# Patient Record
Sex: Male | Born: 1955 | Race: Black or African American | Hispanic: No | Marital: Single | Smoking: Current every day smoker
Health system: Southern US, Community
[De-identification: ages and names within clinical notes are randomized; demographics above are authoritative.]

## PROBLEM LIST (undated history)

## (undated) DIAGNOSIS — S069X9A Unspecified intracranial injury with loss of consciousness of unspecified duration, initial encounter: Secondary | ICD-10-CM

## (undated) DIAGNOSIS — I2699 Other pulmonary embolism without acute cor pulmonale: Secondary | ICD-10-CM

## (undated) DIAGNOSIS — S069XAA Unspecified intracranial injury with loss of consciousness status unknown, initial encounter: Secondary | ICD-10-CM

## (undated) HISTORY — PX: BRAIN SURGERY: SHX531

## (undated) HISTORY — PX: OTHER SURGICAL HISTORY: SHX169

---

## 2005-10-07 DIAGNOSIS — I2699 Other pulmonary embolism without acute cor pulmonale: Secondary | ICD-10-CM

## 2005-10-07 HISTORY — DX: Other pulmonary embolism without acute cor pulmonale: I26.99

## 2007-06-24 ENCOUNTER — Emergency Department (HOSPITAL_COMMUNITY): Admission: EM | Admit: 2007-06-24 | Discharge: 2007-06-24 | Payer: Self-pay | Admitting: Family Medicine

## 2007-11-10 ENCOUNTER — Ambulatory Visit: Payer: Self-pay | Admitting: Internal Medicine

## 2008-11-22 ENCOUNTER — Encounter: Admission: RE | Admit: 2008-11-22 | Discharge: 2009-02-20 | Payer: Self-pay | Admitting: Psychology

## 2012-09-05 ENCOUNTER — Emergency Department (INDEPENDENT_AMBULATORY_CARE_PROVIDER_SITE_OTHER)
Admission: EM | Admit: 2012-09-05 | Discharge: 2012-09-05 | Disposition: A | Discharge status: Nursing Home (Includes ICF) | Payer: Medicare Other | Source: Home / Self Care | Attending: Emergency Medicine | Admitting: Emergency Medicine

## 2012-09-05 ENCOUNTER — Encounter (HOSPITAL_COMMUNITY): Payer: Self-pay | Admitting: Physical Medicine and Rehabilitation

## 2012-09-05 ENCOUNTER — Observation Stay (HOSPITAL_COMMUNITY)
Admission: EM | Admit: 2012-09-05 | Discharge: 2012-09-07 | Disposition: A | Discharge status: Home / Self Care | Payer: Medicare Other | Attending: Internal Medicine | Admitting: Internal Medicine

## 2012-09-05 ENCOUNTER — Encounter (HOSPITAL_COMMUNITY): Payer: Self-pay | Admitting: Emergency Medicine

## 2012-09-05 DIAGNOSIS — E876 Hypokalemia: Secondary | ICD-10-CM | POA: Insufficient documentation

## 2012-09-05 DIAGNOSIS — F17200 Nicotine dependence, unspecified, uncomplicated: Secondary | ICD-10-CM | POA: Diagnosis not present

## 2012-09-05 DIAGNOSIS — R7989 Other specified abnormal findings of blood chemistry: Secondary | ICD-10-CM | POA: Diagnosis present

## 2012-09-05 DIAGNOSIS — I82409 Acute embolism and thrombosis of unspecified deep veins of unspecified lower extremity: Secondary | ICD-10-CM | POA: Diagnosis not present

## 2012-09-05 DIAGNOSIS — M25469 Effusion, unspecified knee: Secondary | ICD-10-CM

## 2012-09-05 DIAGNOSIS — M79609 Pain in unspecified limb: Secondary | ICD-10-CM

## 2012-09-05 DIAGNOSIS — R799 Abnormal finding of blood chemistry, unspecified: Secondary | ICD-10-CM

## 2012-09-05 DIAGNOSIS — D6859 Other primary thrombophilia: Secondary | ICD-10-CM | POA: Diagnosis not present

## 2012-09-05 DIAGNOSIS — M7989 Other specified soft tissue disorders: Secondary | ICD-10-CM

## 2012-09-05 DIAGNOSIS — R748 Abnormal levels of other serum enzymes: Secondary | ICD-10-CM | POA: Insufficient documentation

## 2012-09-05 DIAGNOSIS — D6862 Lupus anticoagulant syndrome: Secondary | ICD-10-CM | POA: Diagnosis present

## 2012-09-05 HISTORY — DX: Unspecified intracranial injury with loss of consciousness status unknown, initial encounter: S06.9XAA

## 2012-09-05 HISTORY — DX: Unspecified intracranial injury with loss of consciousness of unspecified duration, initial encounter: S06.9X9A

## 2012-09-05 HISTORY — DX: Other pulmonary embolism without acute cor pulmonale: I26.99

## 2012-09-05 LAB — BASIC METABOLIC PANEL
BUN: 13 mg/dL (ref 6–23)
Calcium: 10 mg/dL (ref 8.4–10.5)
GFR calc Af Amer: 60 mL/min — ABNORMAL LOW (ref >90)
GFR calc non Af Amer: 52 mL/min — ABNORMAL LOW (ref >90)
Potassium: 3.4 mEq/L — ABNORMAL LOW (ref 3.5–5.1)
Sodium: 131 mEq/L — ABNORMAL LOW (ref 135–145)

## 2012-09-05 LAB — URINALYSIS, ROUTINE W REFLEX MICROSCOPIC
Glucose, UA: NEGATIVE mg/dL
Hgb urine dipstick: NEGATIVE
Ketones, ur: NEGATIVE mg/dL
Leukocytes, UA: NEGATIVE
Nitrite: NEGATIVE
Protein, ur: NEGATIVE mg/dL
Specific Gravity, Urine: 1.017 (ref 1.005–1.030)
Urobilinogen, UA: 1 mg/dL (ref 0.0–1.0)
pH: 6 (ref 5.0–8.0)

## 2012-09-05 LAB — PROTIME-INR
INR: 1.25 (ref 0.00–1.49)
Prothrombin Time: 15.5 s — ABNORMAL HIGH (ref 11.6–15.2)

## 2012-09-05 LAB — CBC
HCT: 37.5 % — ABNORMAL LOW (ref 39.0–52.0)
MCH: 28.6 pg (ref 26.0–34.0)
MCHC: 33.6 g/dL (ref 30.0–36.0)
RDW: 13.5 % (ref 11.5–15.5)

## 2012-09-05 MED ORDER — WARFARIN - PHARMACIST DOSING INPATIENT: Freq: Every day | Status: DC

## 2012-09-05 MED ORDER — SODIUM CHLORIDE 0.9 % IJ SOLN
3.0000 mL | Freq: Two times a day (BID) | INTRAMUSCULAR | Status: DC
  Administered 2012-09-06: 3 mL via INTRAVENOUS

## 2012-09-05 MED ORDER — HEPARIN (PORCINE) IN NACL 100-0.45 UNIT/ML-% IJ SOLN: 4000.0000 [IU]/h | INTRAMUSCULAR | Status: DC

## 2012-09-05 MED ORDER — WARFARIN SODIUM 7.5 MG PO TABS
7.5000 mg | ORAL_TABLET | ORAL | Status: AC
  Administered 2012-09-05: 7.5 mg via ORAL
  Filled 2012-09-05: qty 1

## 2012-09-05 MED ORDER — MORPHINE SULFATE 4 MG/ML IJ SOLN
4.0000 mg | Freq: Once | INTRAMUSCULAR | Status: AC
  Administered 2012-09-05: 4 mg via INTRAVENOUS
  Filled 2012-09-05: qty 1

## 2012-09-05 MED ORDER — HEPARIN BOLUS VIA INFUSION
4000.0000 [IU] | Freq: Once | INTRAVENOUS | Status: AC
  Administered 2012-09-05: 4000 [IU] via INTRAVENOUS

## 2012-09-05 MED ORDER — HEPARIN (PORCINE) IN NACL 100-0.45 UNIT/ML-% IJ SOLN
14.0000 [IU]/kg/h | INTRAMUSCULAR | Status: DC
  Filled 2012-09-05: qty 250

## 2012-09-05 MED ORDER — HEPARIN (PORCINE) IN NACL 100-0.45 UNIT/ML-% IJ SOLN
1100.0000 [IU]/h | INTRAMUSCULAR | Status: DC
  Administered 2012-09-05: 1100 [IU]/h via INTRAVENOUS
  Filled 2012-09-05 (×2): qty 250

## 2012-09-05 NOTE — ED Notes (Signed)
Heparin dosage verified by Heron Sabins

## 2012-09-05 NOTE — ED Provider Notes (Signed)
Medical screening examination/treatment/procedure(s) were performed by non-physician practitioner and as supervising physician I was immediately available for consultation/collaboration.  Leslee Home, M.D.   Reuben Likes, MD 09/05/12 2051

## 2012-09-05 NOTE — Progress Notes (Signed)
VASCULAR LAB PRELIMINARY  PRELIMINARY  PRELIMINARY  PRELIMINARY  Left lower extremity venous Doppler completed.    Preliminary report:  There is extensive, occlusive, acute DVT noted throughout the left lower extremity, beginning in the posterior tibial vein and coursing through to the popliteal, femoral, and common femoral veins.  There is no propagation the right lower extremity.  Antonio Fernandez, 09/05/2012, 6:07 PM

## 2012-09-05 NOTE — ED Notes (Signed)
Patient resting in bed Heparin infusing at 11units per hour no new changes in assessment.

## 2012-09-05 NOTE — Progress Notes (Addendum)
ANTICOAGULATION CONSULT NOTE - Initial Consult  Pharmacy Consult for heparin + coumadin Indication: DVT  No Known Allergies  Patient Measurements:   Heparin Dosing Weight: 70 kg  Vital Signs: Temp: 98.7 F (37.1 C) (11/30 1543) Temp src: Oral (11/30 1543) BP: 150/95 mmHg (11/30 1853) Pulse Rate: 97  (11/30 1856)  Labs:  [facility] 09/05/12 1917  HGB 12.6*  HCT 37.5*  PLT 121*  APTT --  LABPROT --  INR --  HEPARINUNFRC --  CREATININE --  CKTOTAL --  CKMB --  TROPONINI --    CrCl is unknown because no creatinine reading has been taken and the patient has no height on file.   Medical History: No past medical history on file.  Medications:  Scheduled:    .  morphine injection  4 mg Intravenous Once   Infusions:    . heparin    . heparin    . [DISCONTINUED] heparin      Assessment: 56 yr with DVT of left leg will be started on heparin therapy.  H/H 12.6/37.5; Plt 121. Not on oral anticoagulant prior to admission. INR 1.25.  Coumadin score = 7  Goal of Therapy:  Heparin level 0.3-0.7 units/ml Monitor platelets by anticoagulation protocol: Yes   Plan:  1) Heparin 4000 units iv bolus x1, then start heparin drip at 1100 units/hr 2) Heparin level at 6hr  3) Daily heparin level and CBC 4) Coumadin 7.5mg  po x1 5) Daily PT/INR  Argusta Mcgann, Tsz-Yin 09/05/2012,7:44 PM

## 2012-09-05 NOTE — ED Notes (Signed)
Pt presents to department from [facility] for evaluation of L lower leg pain. Ongoing x2 days. 9/10 with weight bearing and walking, pt reports swelling and redness. Describes pain as aching sensation. He is alert and able to answer most questions.

## 2012-09-05 NOTE — ED Notes (Addendum)
Pt given a urinal, trying to urinate

## 2012-09-05 NOTE — ED Provider Notes (Signed)
History     CSN: [id number]  Arrival date & time 09/05/12  1307   First MD Initiated Contact with Patient 09/05/12 1422      Chief Complaint  Patient presents with  . Leg Pain    left leg pain x 2 days.    (Consider location/radiation/quality/duration/timing/severity/associated sxs/prior treatment) Patient is a 56 y.o. male presenting with leg pain. The history is provided by the patient. No language interpreter was used.  Leg Pain  The incident occurred 6 to 12 hours ago. The incident occurred at home. There was no injury mechanism. The pain is present in the left leg. The quality of the pain is described as aching. The pain is at a severity of 5/10. Associated symptoms include inability to bear weight. Pertinent negatives include no numbness. He has tried immobilization for the symptoms.  Pt complains of swelling to left leg.  Pt points to area of pain to left leg.    History reviewed. No pertinent past medical history.  Past Surgical History  Procedure Date  . Brain surgery   . Ivc filter     History reviewed. No pertinent family history.  History  Substance Use Topics  . Smoking status: Current Every Day Smoker  . Smokeless tobacco: Not on file  . Alcohol Use: Yes     Comment: occasional      Review of Systems  Musculoskeletal: Positive for myalgias and joint swelling.  Neurological: Negative for numbness.  All other systems reviewed and are negative.    Allergies  Review of patient's allergies indicates no known allergies.  Home Medications  No current outpatient prescriptions on file.  BP 120/85  Pulse 104  Temp 98.4 F (36.9 C) (Oral)  Resp 20  SpO2 98%  Physical Exam  Nursing note and vitals reviewed. Constitutional: He is oriented to person, place, and time. He appears well-developed and well-nourished.  Musculoskeletal: He exhibits tenderness.       Swollen left calf,  From,  nv and ns intact  Neurological: He is alert and oriented to  person, place, and time. He has normal reflexes.  Skin: Skin is warm.  Psychiatric: He has a normal mood and affect.    ED Course  Procedures (including critical care time)  Labs Reviewed - No data to display No results found.   1. Swelling of joint of lower leg       MDM  I spoke to Dr. Judd Lien in ED.  Pt to ED for doppler study        Elson Areas, [place] 09/05/12 [address] [place], [place] 09/05/12 [phone]

## 2012-09-05 NOTE — ED Notes (Addendum)
Pt c/o left leg pain that started 2 days ago. Sudden on set. Pt states that he woke up with leg hurting. Pain started out in calf and radiates up to knee. Pt denies any injury.   Some swelling. Pt has used tylenol and ace wrap with no relief

## 2012-09-05 NOTE — H&P (Signed)
[facility] History and Physical  Antonio Fernandez ZOX:[id number] DOB: 1956/08/13 DOA: 09/05/2012  Referring physician: ED PCP: Gwynneth Aliment, MD  Specialists: None  Chief Complaint: LLE pain  HPI: Antonio Fernandez is a 56 y.o. male who presents to the ED with pain located in his LLE running up his calf and into his thigh.  Associated with redness and swelling, onset 2 days ago.  Weight bearing makes it worse but is able to bear weight, nothing makes it better, moderate in severity.  Patient has no recent history of travel, he dosent walk too much at baseline though secondary to foot deformities bilaterally.  At an urgent care a LLE doppler ultrasound demonstrated extensive acute DVT with extention into common femoral vein.  In the ED he was found to be hemoccult negative, hemodynamically stable, started on a heparin gtt and hospitalist asked to admit.  Review of Systems: minimal shortness of breath per his sister, no chest pain, 12 systems reviewed and otherwise negative.  Past Medical History  Diagnosis Date  . TBI (traumatic brain injury)   . PE (pulmonary embolism) 2007    Was inpatient for surgery at time.   Past Surgical History  Procedure Date  . Brain surgery   . Ivc filter    Social History:  reports that he has been smoking.  He does not have any smokeless tobacco history on file. He reports that he drinks alcohol. His drug history not on file.  No Known Allergies  Family History  Problem Relation Age of Onset  . Clotting disorder Neg Hx     Prior to Admission medications   Medication Sig Start Date End Date Taking? Authorizing Provider  acetaminophen (TYLENOL) 500 MG tablet Take 500 mg by mouth every 6 (six) hours as needed. For pain   Yes Historical Provider, MD   Physical Exam: Filed Vitals:   09/05/12 1543 09/05/12 1853 09/05/12 1856  BP: 119/82 150/95   Pulse: 111  97  Temp: 98.7 F (37.1 C)    TempSrc: Oral    Resp: 16    SpO2: 100%  99%     General:  NAD, resting comfortably in bed Eyes: PEERLA EOMI ENT: mucous membranes moist Neck: supple w/o JVD Cardiovascular: RRR w/o MRG Respiratory: CTA B Abdomen: soft, nt, nd, bs+ Skin: no rash nor lesion Musculoskeletal: MAE, full ROM all 4 extremities, erythema and tenderness noted along the medial aspect of his LLE starting just below his knee and going up the inside twords his groin, there is tenderness to palpatation over this area and edema. Psychiatric: normal tone and affect Neurologic: AAOx3, grossly non-focal  Labs on Admission:  Basic Metabolic Panel:  Lab 09/05/12 8119  NA 131*  K 3.4*  CL 89*  CO2 28  GLUCOSE 131*  BUN 13  CREATININE 1.45*  CALCIUM 10.0  MG --  PHOS --   Liver Function Tests: No results found for this basename: AST:5,ALT:5,ALKPHOS:5,BILITOT:5,PROT:5,ALBUMIN:5 in the last 168 hours No results found for this basename: LIPASE:5,AMYLASE:5 in the last 168 hours No results found for this basename: AMMONIA:5 in the last 168 hours CBC:  Lab 09/05/12 1917  WBC 11.6*  NEUTROABS --  HGB 12.6*  HCT 37.5*  MCV 85.2  PLT 121*   Cardiac Enzymes: No results found for this basename: CKTOTAL:5,CKMB:5,CKMBINDEX:5,TROPONINI:5 in the last 168 hours  BNP (last 3 results) No results found for this basename: PROBNP:3 in the last 8760 hours CBG: No results found for this basename: GLUCAP:5 in the last 168  hours  Radiological Exams on Admission: No results found.  EKG: Independently reviewed.  Assessment/Plan Principal Problem:  *DVT (deep venous thrombosis) Active Problems:  Elevated serum creatinine   1. 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  Admitting, putting patient on heparin bridge to coumadin pharm to dose.  Ordered work up for some hypercoaguable states as able given  that he is and will be on anticoagulation for the foreseeable future.  Probably needs to be on anticoagulation indefinitely given this unprovoked DVT is his 2nd history of DVT, but will defer to his PCP regarding length of therapy. 2. Elevated serum creatinine - unclear of what his baseline is, recheck this in AM.  No consults obtained.  Code Status: Full Code (must indicate code status--if unknown or must be presumed, indicate so) Family Communication: Spoke with sister in room (indicate person spoken with, if applicable, with phone number if by telephone) Disposition Plan: Admit to obs (indicate anticipated LOS)  Time spent: 70 min  Huxley Vanwagoner M. [facility] Pager [phone]  If 7PM-7AM, please contact night-coverage [email or web address] Password [email or web address] 09/05/2012, 10:03 PM

## 2012-09-05 NOTE — ED Notes (Signed)
Doppler study positive per ultrasound tech for DVT in left leg, triage RN notified.

## 2012-09-05 NOTE — ED Provider Notes (Signed)
History     CSN: [id number]  Arrival date & time 09/05/12  1517   First MD Initiated Contact with Patient 09/05/12 1839      Chief Complaint  Patient presents with  . Leg Pain    (Consider location/radiation/quality/duration/timing/severity/associated sxs/prior treatment) Patient is a 56 y.o. male presenting with leg pain. The history is provided by the patient.  Leg Pain  The incident occurred 2 days ago. The incident occurred at home. There was no injury mechanism. The pain is present in the left leg and left thigh. The quality of the pain is described as aching and burning. The pain is at a severity of 8/10. The pain is moderate. The pain has been constant since onset. Associated symptoms include muscle weakness and tingling. Pertinent negatives include no numbness and no inability to bear weight. The symptoms are aggravated by bearing weight, activity and palpation.    No past medical history on file.  Past Surgical History  Procedure Date  . Brain surgery   . Ivc filter     History reviewed. No pertinent family history.  History  Substance Use Topics  . Smoking status: Current Every Day Smoker  . Smokeless tobacco: Not on file  . Alcohol Use: Yes     Comment: occasional      Review of Systems  Constitutional: Negative for fever and chills.  Respiratory: Negative for cough and shortness of breath.   Neurological: Positive for tingling. Negative for numbness.  All other systems reviewed and are negative.    Allergies  Review of patient's allergies indicates no known allergies.  Home Medications   Current Outpatient Rx  Name  Route  Sig  Dispense  Refill  . ACETAMINOPHEN 500 MG PO TABS   Oral   Take 500 mg by mouth every 6 (six) hours as needed. For pain           BP 150/95  Pulse 97  Temp 98.7 F (37.1 C) (Oral)  Resp 16  SpO2 99%  Physical Exam  Nursing note and vitals reviewed. Constitutional: He is oriented to person, place, and time. He  appears well-developed and well-nourished. No distress.  HENT:  Head: Normocephalic and atraumatic.  Mouth/Throat: No oropharyngeal exudate.  Eyes: EOM are normal. Pupils are equal, round, and reactive to light.  Neck: Normal range of motion. Neck supple.  Cardiovascular: Normal rate and regular rhythm.  Exam reveals no friction rub.   No murmur heard. Pulmonary/Chest: Effort normal and breath sounds normal. No respiratory distress. He has no wheezes. He has no rales.  Abdominal: He exhibits no distension. There is no tenderness. There is no rebound.  Musculoskeletal: Normal range of motion. He exhibits edema (left leg 1+ with erythema. Tender to palpation.).  Neurological: He is alert and oriented to person, place, and time.  Skin: He is not diaphoretic.    ED Course  Procedures (including critical care time)  Labs Reviewed  CBC - Abnormal; Notable for the following:    WBC 11.6 (*)     Hemoglobin 12.6 (*)     HCT 37.5 (*)     Platelets 121 (*)     All other components within normal limits  BASIC METABOLIC PANEL - Abnormal; Notable for the following:    Sodium 131 (*)     Potassium 3.4 (*)     Chloride 89 (*)     Glucose, Bld 131 (*)     Creatinine, Ser 1.45 (*)     GFR calc  non Af Amer 52 (*)     GFR calc Af Amer 60 (*)     All other components within normal limits  URINALYSIS, ROUTINE W REFLEX MICROSCOPIC - Abnormal; Notable for the following:    Color, Urine AMBER (*)  BIOCHEMICALS MAY BE AFFECTED BY COLOR   Bilirubin Urine SMALL (*)     All other components within normal limits  PROTIME-INR - Abnormal; Notable for the following:    Prothrombin Time 15.5 (*)     All other components within normal limits  OCCULT BLOOD, POC DEVICE  APTT  CBC  BASIC METABOLIC PANEL  HOMOCYSTEINE  LUPUS ANTICOAGULANT PANEL  PROTHROMBIN GENE MUTATION  CARDIOLIPIN ANTIBODY  FACTOR 5 LEIDEN  PROTIME-INR  HEPARIN LEVEL (UNFRACTIONATED)   No results found.   1. Elevated serum  creatinine   2. DVT (deep venous thrombosis)       MDM   56 year old male presents with left leg swelling. Initially went to urgent care, where A. lower sugar Doppler was ordered. Doppler showed left leg extensive acute DVT with extension into the common femoral veins. Labs sent. Hemoccult negative. Plan for heparin bolus and admission.        Elwin Mocha, MD 09/05/12 [id number]

## 2012-09-05 NOTE — ED Notes (Signed)
Admitting MD at bedside.

## 2012-09-06 DIAGNOSIS — E876 Hypokalemia: Secondary | ICD-10-CM

## 2012-09-06 DIAGNOSIS — I82409 Acute embolism and thrombosis of unspecified deep veins of unspecified lower extremity: Secondary | ICD-10-CM | POA: Diagnosis not present

## 2012-09-06 DIAGNOSIS — R799 Abnormal finding of blood chemistry, unspecified: Secondary | ICD-10-CM | POA: Diagnosis not present

## 2012-09-06 LAB — BASIC METABOLIC PANEL
Calcium: 9.3 mg/dL (ref 8.4–10.5)
GFR calc non Af Amer: 62 mL/min — ABNORMAL LOW (ref >90)
Sodium: 134 mEq/L — ABNORMAL LOW (ref 135–145)

## 2012-09-06 LAB — PROTIME-INR
INR: 1.31 (ref 0.00–1.49)
Prothrombin Time: 16 seconds — ABNORMAL HIGH (ref 11.6–15.2)

## 2012-09-06 LAB — CBC
HCT: 36.1 % — ABNORMAL LOW (ref 39.0–52.0)
Hemoglobin: 12 g/dL — ABNORMAL LOW (ref 13.0–17.0)
MCH: 28.4 pg (ref 26.0–34.0)
MCV: 85.3 fL (ref 78.0–100.0)
Platelets: 149 10*3/uL — ABNORMAL LOW (ref 150–400)
RBC: 4.23 MIL/uL (ref 4.22–5.81)
WBC: 9.7 10*3/uL (ref 4.0–10.5)

## 2012-09-06 LAB — RAPID URINE DRUG SCREEN, HOSP PERFORMED
Amphetamines: NOT DETECTED
Barbiturates: NOT DETECTED
Cocaine: POSITIVE — AB
Tetrahydrocannabinol: POSITIVE — AB

## 2012-09-06 LAB — HEPARIN LEVEL (UNFRACTIONATED): Heparin Unfractionated: 0.4 IU/mL (ref 0.30–0.70)

## 2012-09-06 LAB — HOMOCYSTEINE: Homocysteine: 14.6 umol/L (ref 4.0–15.4)

## 2012-09-06 MED ORDER — HEPARIN BOLUS VIA INFUSION
1100.0000 [IU] | Freq: Once | INTRAVENOUS | Status: AC
  Administered 2012-09-06: 1100 [IU] via INTRAVENOUS
  Filled 2012-09-06: qty 1100

## 2012-09-06 MED ORDER — WARFARIN SODIUM 7.5 MG PO TABS
7.5000 mg | ORAL_TABLET | Freq: Once | ORAL | Status: AC
  Administered 2012-09-06: 7.5 mg via ORAL
  Filled 2012-09-06: qty 1

## 2012-09-06 MED ORDER — POTASSIUM CHLORIDE CRYS ER 20 MEQ PO TBCR
40.0000 meq | EXTENDED_RELEASE_TABLET | Freq: Four times a day (QID) | ORAL | Status: AC
  Administered 2012-09-06 (×2): 40 meq via ORAL
  Filled 2012-09-06 (×2): qty 2

## 2012-09-06 MED ORDER — HEPARIN (PORCINE) IN NACL 100-0.45 UNIT/ML-% IJ SOLN
1300.0000 [IU]/h | INTRAMUSCULAR | Status: DC
  Administered 2012-09-06 – 2012-09-07 (×2): 1300 [IU]/h via INTRAVENOUS
  Filled 2012-09-06 (×2): qty 250

## 2012-09-06 NOTE — Progress Notes (Signed)
Antonio Fernandez [id number] Admitted to 5500: 09/05/12 23:03 Attending Provider: Hollice Espy, MD    Antonio Fernandez is a 56 y.o. male patient admitted from ED awake, alert  & orientated  X 3,  Full Code, VSS - Blood pressure 138/95, pulse 94, temperature 99 F (37.2 C), temperature source Oral, resp. rate 16, SpO2 97.00%.RA, no c/o shortness of breath, no c/o chest pain, no distress noted. Tele # [id number] placed and pt is currently running:NSR   IV site WDL:  with a transparent dsg that's clean dry and intact.  Allergies:  No Known Allergies   Past Medical History  Diagnosis Date  . TBI (traumatic brain injury)   . PE (pulmonary embolism) 2007    Was inpatient for surgery at time.    History:  obtained from patient  Pt orientation to unit, room and routine. Information packet given to patient and safety video watched.  Admission INP armband ID verified with patient, and in place. SR up x 2, fall risk assessment complete with Patient verbalizing understanding of risks associated with falls. Pt verbalizes an understanding of how to use the call bell and to call for help before getting out of bed.  Skin, clean-dry- intact without evidence of bruising, or skin tears.   No evidence of skin break down noted on exam.    Will cont to monitor and assist as needed.  Elisha Ponder, RN 09/06/2012 12:30 AM

## 2012-09-06 NOTE — Progress Notes (Signed)
ANTICOAGULATION CONSULT NOTE - Follow Up Consult  Pharmacy Consult for heparin and coumadin Indication: DVT  No Known Allergies  Patient Measurements: Height: 5\' 8"  (172.7 cm) Weight: 165 lb 14.4 oz (75.252 kg) IBW/kg (Calculated) : 68.4  Heparin Dosing Weight: 75kg  Vital Signs: Temp: 98.6 F (37 C) (12/01 0600) Temp src: Oral (12/01 0600) BP: 124/69 mmHg (12/01 0600) Pulse Rate: 93  (12/01 0600)  Labs:  Basename 09/06/12 1200 09/06/12 0530 09/05/12 1930 09/05/12 1917  HGB -- 12.0* -- 12.6*  HCT -- 36.1* -- 37.5*  PLT -- 149* -- 121*  APTT -- -- 37 --  LABPROT -- 16.0* 15.5* --  INR -- 1.31 1.25 --  HEPARINUNFRC 0.26* 0.31 -- --  CREATININE -- 1.24 -- 1.45*  CKTOTAL -- -- -- --  CKMB -- -- -- --  TROPONINI -- -- -- --    Estimated Creatinine Clearance: 62.8 ml/min (by C-G formula based on Cr of 1.24).   Medications:  Scheduled:    . heparin  1,100 Units Intravenous Once  . [COMPLETED] heparin  4,000 Units Intravenous Once  . [COMPLETED]  morphine injection  4 mg Intravenous Once  . potassium chloride  40 mEq Oral Q6H  . sodium chloride  3 mL Intravenous Q12H  . [COMPLETED] warfarin  7.5 mg Oral NOW  . warfarin  7.5 mg Oral ONCE-1800  . Warfarin - Pharmacist Dosing Inpatient   Does not apply q1800    Assessment: 56 yr with unprovoked extensive DVT of left leg started on heparin therapy and coumadin per pharmacy. Overlap day 2/5. H/H 12.0/36 Plt 149. No noted bleeding. Has hx of provoked PE in 2007 not on oral anticoagulant prior to admission. HL this AM 0.26 subtherapeutic, INR 1.31. Coumadin score = 7  Goal of Therapy:  Heparin level 0.3-0.7 units/ml Monitor platelets by anticoagulation protocol: Yes   Plan:  1) Heparin bolus 1100 units x1 then heparin drip 1300 units/hour 2) Heparin level at 6hr  3) Daily heparin level and CBC 4) Coumadin 7.5mg  po x1 5) Daily PT/INR  Bola A. Wandra Feinstein D Clinical Pharmacist Pager:[phone] Phone  [phone] 09/06/2012 1:41 PM

## 2012-09-06 NOTE — Progress Notes (Signed)
ANTICOAGULATION CONSULT NOTE  Pharmacy Consult for Heparin and Coumadin Indication: DVT  No Known Allergies  Patient Measurements: Weight: 165 lb 14.4 oz (75.252 kg) Heparin Dosing Weight: 70 kg  Vital Signs: Temp: 99 F (37.2 C) (11/30 2300) Temp src: Oral (11/30 2300) BP: 138/95 mmHg (11/30 2300) Pulse Rate: 94  (11/30 2300)  Labs:  Basename 09/06/12 0530 09/05/12 1930 09/05/12 1917  HGB -- -- 12.6*  HCT -- -- 37.5*  PLT -- -- 121*  APTT -- 37 --  LABPROT 16.0* 15.5* --  INR 1.31 1.25 --  HEPARINUNFRC 0.31 -- --  CREATININE -- -- 1.45*  CKTOTAL -- -- --  CKMB -- -- --  TROPONINI -- -- --    CrCl is unknown because there is no height on file for the current visit.  Assessment: 56 yr with DVT for anticoagulation  Goal of Therapy:  Heparin level 0.3-0.7 units/ml Monitor platelets by anticoagulation protocol: Yes   Plan:  Continue Heparin at current rate for now Recheck level in 6 hours to verify Coumadin 7.5 mg tonight  Eddie Candle 09/06/2012,6:15 AM

## 2012-09-06 NOTE — ED Provider Notes (Signed)
I saw and evaluated the patient, reviewed the resident's note and I agree with the findings and plan.  Toy Baker, MD 09/06/12 2215

## 2012-09-06 NOTE — Progress Notes (Signed)
ANTICOAGULATION CONSULT NOTE - Follow Up Consult  Pharmacy Consult for heparin Indication: DVT  No Known Allergies  Patient Measurements: Height: 5\' 8"  (172.7 cm) Weight: 165 lb 14.4 oz (75.252 kg) IBW/kg (Calculated) : 68.4  Heparin Dosing Weight: 75 kg  Vital Signs: Temp: 99 F (37.2 C) (12/01 1417) Temp src: Oral (12/01 1417) BP: 106/71 mmHg (12/01 1417) Pulse Rate: 98  (12/01 1417)  Labs:  Basename 09/06/12 1928 09/06/12 1200 09/06/12 0530 09/05/12 1930 09/05/12 1917  HGB -- -- 12.0* -- 12.6*  HCT -- -- 36.1* -- 37.5*  PLT -- -- 149* -- 121*  APTT -- -- -- 37 --  LABPROT -- -- 16.0* 15.5* --  INR -- -- 1.31 1.25 --  HEPARINUNFRC 0.40 0.26* 0.31 -- --  CREATININE -- -- 1.24 -- 1.45*  CKTOTAL -- -- -- -- --  CKMB -- -- -- -- --  TROPONINI -- -- -- -- --    Estimated Creatinine Clearance: 62.8 ml/min (by C-G formula based on Cr of 1.24).   Medications:  Scheduled:    . [COMPLETED] heparin  1,100 Units Intravenous Once  . [COMPLETED] heparin  4,000 Units Intravenous Once  . [COMPLETED] potassium chloride  40 mEq Oral Q6H  . sodium chloride  3 mL Intravenous Q12H  . [COMPLETED] warfarin  7.5 mg Oral NOW  . [COMPLETED] warfarin  7.5 mg Oral ONCE-1800  . Warfarin - Pharmacist Dosing Inpatient   Does not apply q1800   Infusions:    . heparin 1,300 Units/hr (09/06/12 1355)  . [DISCONTINUED] heparin 1,100 Units/hr (09/05/12 2029)    Assessment: 56 yo male with DVT is currently on therapeutic heparin.  Heparin level was 0.4. Goal of Therapy:  Heparin level 0.3-0.7 units/ml Monitor platelets by anticoagulation protocol: Yes   Plan:  1) Continue heparin at 1300 units/hr 2) Heparin level and CBC in am  Alexea Blase, Tsz-Yin 09/06/2012,8:10 PM

## 2012-09-06 NOTE — Progress Notes (Signed)
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  Son-in-law left his telephone number if there were any questions [phone].  Will continue to monitor patient.

## 2012-09-06 NOTE — Progress Notes (Signed)
[facility] PROGRESS NOTE  Antonio Fernandez ZOX:[id number] DOB: [birth year] DOA: 09/05/2012 PCP: Gwynneth Aliment, MD  HPI/Subjective: Denies pain, no shortness of breath or chest pain.   Assessment/Plan:  DVT -Recurrent DVT, had previous episode in 2007 while he's in the hospital for brain surgery. -This episode appears to be a provoked. -Started on heparin drip and Coumadin. Pharmacy to followup on the INR. -Consider anticoagulation indefinitely at this is his second unprovoked DVT. Deferred to PCP.  Acute kidney injury -Patient came in with creatinine of 1.49, consistent with acute kidney injury. -After IV fluid hydration his creatinine went down to 1.24.  Hypokalemia -Replace with oral supplements, check BMP in a.m.  Code Status: Full Family Communication:  Disposition Plan: Continue observation.   Consultants:  None  Procedures:  None  Antibiotics:  None   Objective: Filed Vitals:   09/05/12 2230 09/05/12 2300 09/06/12 0600 09/06/12 0629  BP: 123/88 138/95 124/69   Pulse: 102 94 93   Temp:  99 F (37.2 C) 98.6 F (37 C)   TempSrc:  Oral Oral   Resp: 15 16 18    Height:    5\' 8"  (1.727 m)  Weight:  75.252 kg (165 lb 14.4 oz)    SpO2: 97% 97% 97%     Intake/Output Summary (Last 24 hours) at 09/06/12 1236 Last data filed at 09/06/12 0600  Gross per 24 hour  Intake    350 ml  Output    300 ml  Net     50 ml   Filed Weights   09/05/12 2300  Weight: 75.252 kg (165 lb 14.4 oz)    Exam:  General: Alert and awake, oriented x3, not in any acute distress. HEENT: anicteric sclera, pupils reactive to light and accommodation, EOMI CVS: S1-S2 clear, no murmur rubs or gallops Chest: clear to auscultation bilaterally, no wheezing, rales or rhonchi Abdomen: soft nontender, nondistended, normal bowel sounds, no organomegaly Extremities: no cyanosis, clubbing or edema noted bilaterally Neuro: Cranial nerves II-XII intact, no focal neurological  deficits  Data Reviewed: Basic Metabolic Panel:  Lab 09/06/12 8119 09/05/12 1917  NA 134* 131*  K 3.3* 3.4*  CL 92* 89*  CO2 32 28  GLUCOSE 98 131*  BUN 10 13  CREATININE 1.24 1.45*  CALCIUM 9.3 10.0  MG -- --  PHOS -- --   Liver Function Tests: No results found for this basename: AST:5,ALT:5,ALKPHOS:5,BILITOT:5,PROT:5,ALBUMIN:5 in the last 168 hours No results found for this basename: LIPASE:5,AMYLASE:5 in the last 168 hours No results found for this basename: AMMONIA:5 in the last 168 hours CBC:  Lab 09/06/12 0530 09/05/12 1917  WBC 9.7 11.6*  NEUTROABS -- --  HGB 12.0* 12.6*  HCT 36.1* 37.5*  MCV 85.3 85.2  PLT 149* 121*   Cardiac Enzymes: No results found for this basename: CKTOTAL:5,CKMB:5,CKMBINDEX:5,TROPONINI:5 in the last 168 hours BNP (last 3 results) No results found for this basename: PROBNP:3 in the last 8760 hours CBG: No results found for this basename: GLUCAP:5 in the last 168 hours  No results found for this or any previous visit (from the past 240 hour(s)).   Studies: No results found.  Scheduled Meds:   . [COMPLETED] heparin  4,000 Units Intravenous Once  . [COMPLETED]  morphine injection  4 mg Intravenous Once  . sodium chloride  3 mL Intravenous Q12H  . [COMPLETED] warfarin  7.5 mg Oral NOW  . warfarin  7.5 mg Oral ONCE-1800  . Warfarin - Pharmacist Dosing Inpatient   Does not apply [phone]  Continuous Infusions:   . heparin 1,100 Units/hr (09/05/12 2029)  . [DISCONTINUED] heparin    . [DISCONTINUED] heparin      Principal Problem:  *DVT (deep venous thrombosis) Active Problems:  Elevated serum creatinine    Time spent: 35 minutes    [facility] A  [facility] Pager [phone] If 8PM-8AM, please contact night-coverage at [email or web address], password [facility] 09/06/2012, 12:36 PM  LOS: 1 day

## 2012-09-07 DIAGNOSIS — D6862 Lupus anticoagulant syndrome: Secondary | ICD-10-CM | POA: Diagnosis present

## 2012-09-07 DIAGNOSIS — D6859 Other primary thrombophilia: Secondary | ICD-10-CM

## 2012-09-07 DIAGNOSIS — E876 Hypokalemia: Secondary | ICD-10-CM | POA: Diagnosis not present

## 2012-09-07 DIAGNOSIS — R799 Abnormal finding of blood chemistry, unspecified: Secondary | ICD-10-CM | POA: Diagnosis not present

## 2012-09-07 DIAGNOSIS — I82409 Acute embolism and thrombosis of unspecified deep veins of unspecified lower extremity: Secondary | ICD-10-CM | POA: Diagnosis not present

## 2012-09-07 LAB — PROTIME-INR
INR: 1.52 — ABNORMAL HIGH (ref 0.00–1.49)
Prothrombin Time: 17.9 seconds — ABNORMAL HIGH (ref 11.6–15.2)

## 2012-09-07 LAB — PROTHROMBIN GENE MUTATION

## 2012-09-07 LAB — BASIC METABOLIC PANEL
BUN: 9 mg/dL (ref 6–23)
Chloride: 96 mEq/L (ref 96–112)
GFR calc Af Amer: 89 mL/min — ABNORMAL LOW (ref >90)
GFR calc non Af Amer: 76 mL/min — ABNORMAL LOW (ref >90)
Potassium: 3.9 mEq/L (ref 3.5–5.1)
Sodium: 133 mEq/L — ABNORMAL LOW (ref 135–145)

## 2012-09-07 LAB — LUPUS ANTICOAGULANT PANEL
Lupus Anticoagulant: DETECTED — AB
PTT Lupus Anticoagulant: 111.8 secs — ABNORMAL HIGH (ref 28.0–43.0)

## 2012-09-07 LAB — CBC
HCT: 34.2 % — ABNORMAL LOW (ref 39.0–52.0)
Hemoglobin: 11.7 g/dL — ABNORMAL LOW (ref 13.0–17.0)
MCHC: 34.2 g/dL (ref 30.0–36.0)
RBC: 4.01 MIL/uL — ABNORMAL LOW (ref 4.22–5.81)

## 2012-09-07 LAB — BETA-2-GLYCOPROTEIN I ABS, IGG/M/A: Beta-2-Glycoprotein I IgA: 9 A Units (ref <20)

## 2012-09-07 LAB — HEPARIN LEVEL (UNFRACTIONATED): Heparin Unfractionated: 0.36 IU/mL (ref 0.30–0.70)

## 2012-09-07 MED ORDER — RIVAROXABAN 15 MG PO TABS: 15.0000 mg | ORAL_TABLET | Freq: Two times a day (BID) | ORAL | Status: DC

## 2012-09-07 MED ORDER — RIVAROXABAN 15 MG PO TABS
15.0000 mg | ORAL_TABLET | Freq: Two times a day (BID) | ORAL | Status: DC
  Administered 2012-09-07: 15 mg via ORAL
  Filled 2012-09-07 (×3): qty 1

## 2012-09-07 NOTE — Care Management Note (Addendum)
    Page 1 of 1   09/07/2012     3:26:09 PM   CARE MANAGEMENT NOTE 09/07/2012  Patient:  Antonio Fernandez, Antonio Fernandez   Account Number:  [id number]  Date Initiated:  09/07/2012  Documentation initiated by:  Letha Cape  Subjective/Objective Assessment:   dx dvt  admit as observation- from [facility].     Action/Plan:   Anticipated DC Date:  09/07/2012   Anticipated DC Plan:  HOME/SELF CARE      DC Planning Services  CM consult      Choice offered to / List presented to:             Status of service:  Completed, signed off Medicare Important Message given?   (If response is "NO", the following Medicare IM given date fields will be blank) Date Medicare IM given:   Date Additional Medicare IM given:    Discharge Disposition:  HOME/SELF CARE  Per UR Regulation:  Reviewed for med. necessity/level of care/duration of stay  If discussed at Long Length of Stay Meetings, dates discussed:    Comments:  09/07/12 14:20 Letha Cape RN, BSN [phone] patient lives at [facility], patient has medicare/medicaid, he has transportation by his cousin Abram Sander at [employer] her phone is [phone].  Pta independent, Albin Felling takes him to his MD appts as well.  Albin Felling stated she would like to get HealthCare POA forms, referral made to CSW for forms.  xarelto is a preferred drug under medicaid and I called the [facility] pharmacy on [address] to see if they have it in stock and they do, informed patient and Albin Felling.

## 2012-09-07 NOTE — Progress Notes (Signed)
1440 Discharge instructions reviewed with patient and his  Cousin. Verbalize and understands. Prescriptions given to patient. Skin WNL

## 2012-09-07 NOTE — Discharge Summary (Signed)
Physician Discharge Summary  [place] AOZ:[id number] DOB: 01-05-1956 DOA: 09/05/2012  PCP: Gwynneth Aliment, MD  Admit date: 09/05/2012 Discharge date: 09/07/2012  Time spent: 40 minutes  Recommendations for Outpatient Follow-up:  1. Followup with Dr. Allyne Gee, he needs prescription for Xarelto 20 mg in 3 weeks.  Discharge Diagnoses:  Principal Problem:  *DVT (deep venous thrombosis) Active Problems:  Elevated serum creatinine  Hypokalemia   Discharge Condition: Stable  Diet recommendation: Regular diet  Filed Weights   09/05/12 2300  Weight: 75.252 kg (165 lb 14.4 oz)    History of present illness:  Haidyn Chadderdon is a 56 y.o. male who presents to the ED with pain located in his LLE running up his calf and into his thigh. Associated with redness and swelling, onset 2 days ago. Weight bearing makes it worse but is able to bear weight, nothing makes it better, moderate in severity. Patient has no recent history of travel, he dosent walk too much at baseline though secondary to foot deformities bilaterally.  At an urgent care a LLE doppler ultrasound demonstrated extensive acute DVT with extention into common femoral vein. In the ED he was found to be hemoccult negative, hemodynamically stable, started on a heparin gtt and hospitalist asked to admit.   Hospital Course:   1. Acute DVT: Recurrent DVT, patient had previous episode before in 2007 while he's in the hospital for traumatic brain injury. At that time this was thought to be secondary to his immobility and his recent surgery. This episode of DVT seems to be unprovoked, he does not have a history of travel, no smoking, no history of malignancy. At the time of admission he was started on heparin drip and Coumadin secondary to the extensive left lower extremity DVT. Unfortunately we were not to obtain all of his hypercoagulability panel. We did not do protein S, protein C and antithrombin activity. Patient did well and has  pain improved, he was heparin was discontinued after patient was switched to Xarelto. Patient placed on 15 mg of Xarelto twice a day, patient will need to prescription for Xarelto 20 mg daily.  2. Thrombophilia: Patient probably had hypercoagulable status, after he was discharged his hypercoagulable panel is back for positive lupus anticoagulants. In addition to his previous history of DVT, he might need to anticoagulation indefinitely. This was not discussed with the patient as he was discharged prior to results of the hypercoagulable panel. I will leave this to his primary care physician Dr. Allyne Gee.  3. Polysubstance abuse: Patient urine drug screen is positive for traces of cocaine, THC and opiates. Patient reported use of all of these recreational drugs, he was counseled extensively about recreational drug use.  4. Acute kidney injury: Patient came in with creatinine of 1.4, this was likely secondary to dehydration, patient aggressively hydrated with IV fluids, creatinine is 1.05 on discharge. Patient also hypokalemic and this was repleted by oral supplements.  Procedures:  Left lower extremity Doppler showed acute DVT.  Consultations:  None  Discharge Exam: Filed Vitals:   09/06/12 0629 09/06/12 1417 09/06/12 2043 09/07/12 0448  BP:  106/71 129/77 108/71  Pulse:  98 93 106  Temp:  99 F (37.2 C) 99.6 F (37.6 C) 99.8 F (37.7 C)  TempSrc:  Oral Oral Oral  Resp:  18 19 19   Height: 5\' 8"  (1.727 m)     Weight:      SpO2:  99% 97% 96%   General: Alert and awake, oriented x3, not in any acute  distress. HEENT: anicteric sclera, pupils reactive to light and accommodation, EOMI CVS: S1-S2 clear, no murmur rubs or gallops Chest: clear to auscultation bilaterally, no wheezing, rales or rhonchi Abdomen: soft nontender, nondistended, normal bowel sounds, no organomegaly Extremities: no cyanosis, clubbing or edema noted bilaterally Neuro: Cranial nerves II-XII intact, no focal  neurological deficits  Discharge Instructions  Discharge Orders    Future Orders Please Complete By Expires   Increase activity slowly          Medication List     As of 09/07/2012  5:06 PM    TAKE these medications         acetaminophen 500 MG tablet   Commonly known as: TYLENOL   Take 500 mg by mouth every 6 (six) hours as needed. For pain      Rivaroxaban 15 MG Tabs tablet   Commonly known as: XARELTO   Take 1 tablet (15 mg total) by mouth 2 (two) times daily.           Follow-up Information    Follow up with SANDERS,ROBYN N, MD. In 1 week.   Contact information:   [address] [place] [address] [phone]           The results of significant diagnostics from this hospitalization (including imaging, microbiology, ancillary and laboratory) are listed below for reference.    Significant Diagnostic Studies: No results found.  Microbiology: No results found for this or any previous visit (from the past 240 hour(s)).   Labs: Basic Metabolic Panel:  Lab 09/07/12 9147 09/06/12 0530 09/05/12 1917  NA 133* 134* 131*  K 3.9 3.3* 3.4*  CL 96 92* 89*  CO2 27 32 28  GLUCOSE 100* 98 131*  BUN 9 10 13   CREATININE 1.05 1.24 1.45*  CALCIUM 9.1 9.3 10.0  MG -- -- --  PHOS -- -- --   Liver Function Tests: No results found for this basename: AST:5,ALT:5,ALKPHOS:5,BILITOT:5,PROT:5,ALBUMIN:5 in the last 168 hours No results found for this basename: LIPASE:5,AMYLASE:5 in the last 168 hours No results found for this basename: AMMONIA:5 in the last 168 hours CBC:  Lab 09/07/12 0530 09/06/12 0530 09/05/12 1917  WBC 8.9 9.7 11.6*  NEUTROABS -- -- --  HGB 11.7* 12.0* 12.6*  HCT 34.2* 36.1* 37.5*  MCV 85.3 85.3 85.2  PLT 196 149* 121*   Cardiac Enzymes: No results found for this basename: CKTOTAL:5,CKMB:5,CKMBINDEX:5,TROPONINI:5 in the last 168 hours BNP: BNP (last 3 results) No results found for this basename: PROBNP:3 in the last 8760  hours CBG: No results found for this basename: GLUCAP:5 in the last 168 hours     Signed:  Elnore Cosens A  [facility] 09/07/2012, 5:06 PM

## 2012-09-14 DIAGNOSIS — Z79899 Other long term (current) drug therapy: Secondary | ICD-10-CM | POA: Diagnosis not present

## 2012-09-14 DIAGNOSIS — L03119 Cellulitis of unspecified part of limb: Secondary | ICD-10-CM | POA: Diagnosis not present

## 2012-09-14 DIAGNOSIS — R0602 Shortness of breath: Secondary | ICD-10-CM | POA: Diagnosis not present

## 2012-09-14 DIAGNOSIS — F17200 Nicotine dependence, unspecified, uncomplicated: Secondary | ICD-10-CM | POA: Diagnosis not present

## 2012-09-14 DIAGNOSIS — D649 Anemia, unspecified: Secondary | ICD-10-CM | POA: Diagnosis not present

## 2012-09-14 DIAGNOSIS — E559 Vitamin D deficiency, unspecified: Secondary | ICD-10-CM | POA: Diagnosis not present

## 2012-09-14 DIAGNOSIS — L02419 Cutaneous abscess of limb, unspecified: Secondary | ICD-10-CM | POA: Diagnosis not present

## 2012-09-14 DIAGNOSIS — I10 Essential (primary) hypertension: Secondary | ICD-10-CM | POA: Diagnosis not present

## 2012-09-14 DIAGNOSIS — I82409 Acute embolism and thrombosis of unspecified deep veins of unspecified lower extremity: Secondary | ICD-10-CM | POA: Diagnosis not present

## 2012-09-18 DIAGNOSIS — M2010 Hallux valgus (acquired), unspecified foot: Secondary | ICD-10-CM | POA: Diagnosis not present

## 2012-09-18 DIAGNOSIS — M79609 Pain in unspecified limb: Secondary | ICD-10-CM | POA: Diagnosis not present

## 2012-09-18 DIAGNOSIS — M25579 Pain in unspecified ankle and joints of unspecified foot: Secondary | ICD-10-CM | POA: Diagnosis not present

## 2012-09-18 DIAGNOSIS — M779 Enthesopathy, unspecified: Secondary | ICD-10-CM | POA: Diagnosis not present

## 2012-09-18 LAB — CARDIOLIPIN ANTIBODY: Phospholipids: 163

## 2012-12-14 DIAGNOSIS — R634 Abnormal weight loss: Secondary | ICD-10-CM | POA: Diagnosis not present

## 2012-12-14 DIAGNOSIS — R5381 Other malaise: Secondary | ICD-10-CM | POA: Diagnosis not present

## 2012-12-14 DIAGNOSIS — I82409 Acute embolism and thrombosis of unspecified deep veins of unspecified lower extremity: Secondary | ICD-10-CM | POA: Diagnosis not present

## 2012-12-14 DIAGNOSIS — E559 Vitamin D deficiency, unspecified: Secondary | ICD-10-CM | POA: Diagnosis not present

## 2012-12-14 DIAGNOSIS — Z79899 Other long term (current) drug therapy: Secondary | ICD-10-CM | POA: Diagnosis not present

## 2012-12-14 DIAGNOSIS — I10 Essential (primary) hypertension: Secondary | ICD-10-CM | POA: Diagnosis not present

## 2013-12-09 ENCOUNTER — Ambulatory Visit (INDEPENDENT_AMBULATORY_CARE_PROVIDER_SITE_OTHER): Payer: Medicare Other | Admitting: Family Medicine

## 2013-12-09 VITALS — BP 144/82 | HR 97 | Temp 99.4°F | Resp 18 | Ht 67.0 in | Wt 137.0 lb

## 2013-12-09 DIAGNOSIS — G2401 Drug induced subacute dyskinesia: Secondary | ICD-10-CM

## 2013-12-09 DIAGNOSIS — D689 Coagulation defect, unspecified: Secondary | ICD-10-CM

## 2013-12-09 DIAGNOSIS — M79609 Pain in unspecified limb: Secondary | ICD-10-CM

## 2013-12-09 DIAGNOSIS — M79604 Pain in right leg: Secondary | ICD-10-CM

## 2013-12-09 DIAGNOSIS — R3589 Other polyuria: Secondary | ICD-10-CM

## 2013-12-09 DIAGNOSIS — R358 Other polyuria: Secondary | ICD-10-CM

## 2013-12-09 LAB — POCT URINALYSIS DIPSTICK
Bilirubin, UA: NEGATIVE
Blood, UA: NEGATIVE
Glucose, UA: NEGATIVE
Ketones, UA: NEGATIVE
Leukocytes, UA: NEGATIVE
Nitrite, UA: NEGATIVE
Protein, UA: NEGATIVE
Spec Grav, UA: 1.015
Urobilinogen, UA: 4
pH, UA: 5.5

## 2013-12-09 LAB — POCT CBC
Granulocyte percent: 68.2 %G (ref 37–80)
HCT, POC: 39.5 % — AB (ref 43.5–53.7)
Hemoglobin: 12.1 g/dL — AB (ref 14.1–18.1)
Lymph, poc: 1.5 (ref 0.6–3.4)
MCH, POC: 28.5 pg (ref 27–31.2)
MCHC: 30.6 g/dL — AB (ref 31.8–35.4)
MCV: 92.9 fL (ref 80–97)
MID (cbc): 0.8 (ref 0–0.9)
MPV: 7.7 fL (ref 0–99.8)
POC Granulocyte: 4.8 (ref 2–6.9)
POC LYMPH PERCENT: 20.9 %L (ref 10–50)
POC MID %: 10.9 %M (ref 0–12)
Platelet Count, POC: 336 10*3/uL (ref 142–424)
RBC: 4.25 M/uL — AB (ref 4.69–6.13)
RDW, POC: 13.4 %
WBC: 7.1 10*3/uL (ref 4.6–10.2)

## 2013-12-09 LAB — POCT UA - MICROSCOPIC ONLY
Bacteria, U Microscopic: NEGATIVE
Casts, Ur, LPF, POC: NEGATIVE
Crystals, Ur, HPF, POC: NEGATIVE
RBC, urine, microscopic: NEGATIVE
Yeast, UA: NEGATIVE

## 2013-12-09 LAB — GLUCOSE, POCT (MANUAL RESULT ENTRY): POC Glucose: 129 mg/dl — AB (ref 70–99)

## 2013-12-09 MED ORDER — RIVAROXABAN 15 MG PO TABS
15.0000 mg | ORAL_TABLET | Freq: Two times a day (BID) | ORAL | Status: DC
Start: 2013-12-09 — End: 2013-12-10

## 2013-12-09 NOTE — Progress Notes (Addendum)
This chart was scribed for Elvina Sidle, MD by Nicholos Johns, Medical Scribe. This patient's care was started at 60:51 PM 7:51 PM   Patient ID: ZARION OLIFF MRN: [id number], DOB: 1955-12-11, 58 y.o. Date of Encounter: 12/09/2013, 7:51 PM  Primary Physician: Gwynneth Aliment, MD  Chief Complaint: right leg pain  HPI: 58 y.o. year old male with history below presents with right leg pain, 1-2 weeks. Also reports frequency for the last week. Says he has not been eating much. States he had a PE back in 2007 and had it taken out due to concern from the doctor that it would move to his heart. Reports current pain may be similar to past clots. Was taking a blood thinner but is no longer taking it. Currently homeless and living with a friend.  Past Medical History  Diagnosis Date   TBI (traumatic brain injury)    PE (pulmonary embolism) 2007    Was inpatient for surgery at time.     Home Meds: Prior to Admission medications   Medication Sig Start Date End Date Taking? Authorizing Provider  Rivaroxaban (XARELTO) 15 MG TABS tablet Take 1 tablet (15 mg total) by mouth 2 (two) times daily. 09/07/12  Yes Clydia Llano, MD  acetaminophen (TYLENOL) 500 MG tablet Take 500 mg by mouth every 6 (six) hours as needed. For pain    Historical Provider, MD    Allergies: No Known Allergies  History   Social History   Marital Status: Single    Spouse Name: N/A    Number of Children: N/A   Years of Education: N/A   Occupational History   Not on file.   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   Sexual Activity: No   Other Topics Concern   Not on file   Social History Narrative   No narrative on file     Review of Systems: Constitutional: negative for chills, fever, night  sweats, weight changes, or fatigue  HEENT: negative for vision changes, hearing loss, congestion, rhinorrhea, ST, epistaxis, or sinus pressure Cardiovascular: negative for chest pain or palpitations Respiratory: negative for hemoptysis, wheezing, shortness of breath, or cough Abdominal: negative for abdominal pain, nausea, vomiting, diarrhea, or constipation Dermatological: negative for rash Neurologic: negative for headache, dizziness, or syncope Musculoskeletal: right leg pain All other systems reviewed and are otherwise negative with the exception to those above and in the HPI.   Physical Exam: Blood pressure 144/82, pulse 97, temperature 99.4 F (37.4 C), temperature source Oral, resp. rate 18, height 5\' 7"  (1.702 m), weight 137 lb (62.143 kg), SpO2 99.00%., Body mass index is 21.45 kg/(m^2). General: Well developed, well nourished, in no acute distress. Head: Normocephalic, atraumatic, eyes without discharge, sclera non-icteric, nares are without discharge. Bilateral auditory canals clear, TM's are without perforation, pearly grey and translucent with reflective cone of light bilaterally. Oral cavity moist, posterior pharynx without exudate, erythema, peritonsillar abscess, or post nasal drip. tardive dyskinesia   Neck: Supple. No thyromegaly. Full ROM. No lymphadenopathy. Lungs: Clear bilaterally to auscultation without wheezes, rales, or rhonchi. Breathing is unlabored. Heart: RRR with S1 S2. No murmurs, rubs, or gallops appreciated. Abdomen: Soft, non-tender, non-distended with normoactive bowel sounds. No hepatomegaly.  No rebound/guarding. No obvious abdominal masses. Msk:  Muscle wasting of upper and lower extremities bilaterally. Extremities/Skin: Warm and dry. No clubbing or cyanosis. No edema. No rashes or suspicious lesions. Neuro: Alert and oriented X 3. Moves all extremities spontaneously. Gait is normal. CNII-XII grossly in tact. Psych:  Responds to questions appropriately  with a normal affect.   Labs: Results for orders placed in visit on 12/09/13  POCT UA - MICROSCOPIC ONLY      Result Value Ref Range   WBC, Ur, HPF, POC 0-1     RBC, urine, microscopic neg     Bacteria, U Microscopic neg     Mucus, UA small     Epithelial cells, urine per micros 0-4     Crystals, Ur, HPF, POC neg     Casts, Ur, LPF, POC neg     Yeast, UA neg    POCT URINALYSIS DIPSTICK      Result Value Ref Range   Color, UA yellow     Clarity, UA clear     Glucose, UA neg     Bilirubin, UA neg     Ketones, UA neg     Spec Grav, UA 1.015     Blood, UA neg     pH, UA 5.5     Protein, UA neg     Urobilinogen, UA 4.0     Nitrite, UA neg     Leukocytes, UA Negative    POCT CBC      Result Value Ref Range   WBC 7.1  4.6 - 10.2 K/uL   Lymph, poc 1.5  0.6 - 3.4   POC LYMPH PERCENT 20.9  10 - 50 %L   MID (cbc) 0.8  0 - 0.9   POC MID % 10.9  0 - 12 %M   POC Granulocyte 4.8  2 - 6.9   Granulocyte percent 68.2  37 - 80 %G   RBC 4.25 (*) 4.69 - 6.13 M/uL   Hemoglobin 12.1 (*) 14.1 - 18.1 g/dL   HCT, POC 16.139.5 (*) 09.643.5 - 53.7 %   MCV 92.9  80 - 97 fL   MCH, POC 28.5  27 - 31.2 pg   MCHC 30.6 (*) 31.8 - 35.4 g/dL   RDW, POC 04.513.4     Platelet Count, POC 336  142 - 424 K/uL   MPV 7.7  0 - 99.8 fL  GLUCOSE, POCT (MANUAL RESULT ENTRY)      Result Value Ref Range   POC Glucose 129 (*) 70 - 99 mg/dl     ASSESSMENT AND PLAN:  58 y.o. year old male with leg pain, tardive dyskinesia, and h/o coagulopathy Venous doppler tomorrow Leg pain, right - Plan: POCT UA - Microscopic Only, POCT urinalysis dipstick, POCT CBC, Comprehensive metabolic panel, POCT glucose (manual entry), Rivaroxaban (XARELTO) 15 MG TABS tablet  Tardive dyskinesia  Polyuria - Plan: POCT UA - Microscopic Only, POCT urinalysis dipstick, POCT CBC, Comprehensive metabolic panel, POCT glucose (manual entry) Venous doppler in am Signed, Elvina SidleKurt Lauenstein, MD    Signed, Elvina SidleKurt Lauenstein, MD 12/09/2013 7:51 PM

## 2013-12-10 ENCOUNTER — Ambulatory Visit (HOSPITAL_COMMUNITY)
Admission: RE | Admit: 2013-12-10 | Discharge: 2013-12-10 | Disposition: A | Discharge status: Home / Self Care | Payer: Medicare Other | Source: Ambulatory Visit | Attending: Family Medicine | Admitting: Family Medicine

## 2013-12-10 ENCOUNTER — Other Ambulatory Visit (HOSPITAL_COMMUNITY): Payer: Self-pay | Admitting: Family Medicine

## 2013-12-10 ENCOUNTER — Telehealth: Payer: Self-pay

## 2013-12-10 ENCOUNTER — Ambulatory Visit (INDEPENDENT_AMBULATORY_CARE_PROVIDER_SITE_OTHER): Payer: Medicare Other | Admitting: Family Medicine

## 2013-12-10 VITALS — BP 138/82 | HR 83 | Temp 98.2°F | Resp 16 | Ht 66.5 in | Wt 136.0 lb

## 2013-12-10 DIAGNOSIS — I82A13 Acute embolism and thrombosis of axillary vein, bilateral: Secondary | ICD-10-CM

## 2013-12-10 DIAGNOSIS — M79609 Pain in unspecified limb: Secondary | ICD-10-CM | POA: Insufficient documentation

## 2013-12-10 DIAGNOSIS — M79606 Pain in leg, unspecified: Secondary | ICD-10-CM

## 2013-12-10 DIAGNOSIS — I82A19 Acute embolism and thrombosis of unspecified axillary vein: Secondary | ICD-10-CM

## 2013-12-10 DIAGNOSIS — M7989 Other specified soft tissue disorders: Secondary | ICD-10-CM | POA: Diagnosis not present

## 2013-12-10 DIAGNOSIS — M79604 Pain in right leg: Secondary | ICD-10-CM

## 2013-12-10 DIAGNOSIS — I82409 Acute embolism and thrombosis of unspecified deep veins of unspecified lower extremity: Secondary | ICD-10-CM | POA: Insufficient documentation

## 2013-12-10 LAB — COMPREHENSIVE METABOLIC PANEL
ALT: 18 U/L (ref 0–53)
AST: 18 U/L (ref 0–37)
Albumin: 3.6 g/dL (ref 3.5–5.2)
Alkaline Phosphatase: 83 U/L (ref 39–117)
BUN: 12 mg/dL (ref 6–23)
CO2: 29 mEq/L (ref 19–32)
Calcium: 9.5 mg/dL (ref 8.4–10.5)
Chloride: 94 mEq/L — ABNORMAL LOW (ref 96–112)
Creat: 0.98 mg/dL (ref 0.50–1.35)
Glucose, Bld: 121 mg/dL — ABNORMAL HIGH (ref 70–99)
Potassium: 4 mEq/L (ref 3.5–5.3)
Sodium: 134 mEq/L — ABNORMAL LOW (ref 135–145)
Total Bilirubin: 0.6 mg/dL (ref 0.2–1.2)
Total Protein: 8 g/dL (ref 6.0–8.3)

## 2013-12-10 MED ORDER — RIVAROXABAN 15 MG PO TABS
15.0000 mg | ORAL_TABLET | Freq: Two times a day (BID) | ORAL | Status: DC
Start: 2013-12-10 — End: 2013-12-15

## 2013-12-10 NOTE — Telephone Encounter (Signed)
Finally got in touch with driver and [facility]

## 2013-12-10 NOTE — Patient Instructions (Addendum)
Start taking your xarelto today- it is very important that you start on this right away! If there is any problem please have the pharmacist call our clinic.  After 3 weeks, you will change to taking the xarelto just once a day and will continue to 6 months.   Please come and see us in 1 week for a recheck- sooner if you feel worse.  If you feel short of breath please get help right away

## 2013-12-10 NOTE — Progress Notes (Signed)
[facility] [address], [place] [id number] [phone]  Date:  12/10/2013   Name:  Antonio Fernandez   DOB:  05/26/1956   MRN:  [id number]  PCP:  Gwynneth AlimentSANDERS,ROBYN N, MD    Chief Complaint: Medication Problem   History of Present Illness:  Antonio Fernandez is a 58 y.o. very pleasant male patient who presents with the following:  He was seen here yesterday by Dr. Milus GlazierLauenstein with likely DVT and complaint of pain in the right leg for 1 or 2 weeks. He has a history of PE in 2007.  His chart states that he has lupus anticoagulant disorder but he is not really aware of this dx.  Chart also states that he has a filter but he is not sure of this history either, unsure when it might have been placed.   He was sent for a doppler today with the following result:  Bilateral lower extremity venous duplex completed.  Preliminary report: Positive for acute DVT of the right posterior tibial vein coursing through the popliteal , profunda, femoral and common femoral veins with a absence of flow. There are also several calf vein DVTs. There is no evidence of superficial thrombus except for the saphenofemoral junction. No evidence of a Baker's cyst. Protocol mandated that due to the DVT on the right the left would also be evaluated. Left Postive for an acute DVT cousring from the popliteal vein through the femoral vein and into the distal common femoral vein to the saphenofemoral junction. There is also diffuse areas of DVT of the posterior tibial vein. Minimal flow is noted throughout the leg. There is no evidence of superficial thrombosis or Baker's cyst  SLAUGHTER, [place], RVS  12/10/2013, 2:22 PM  He notes that his right leg is swollen and tender, but does not have any SOB.  He did not yet pick up the xarelto.    Mr. Antonio ListerWashington is homeless but is currently staying with a friend.  He feels that he has a safe place to stay and does not need emergency housing at this time.    His  history does also mention a history of brain surgery and a TBI.  These dates are also not certain but occurred prior to 2013 per H and P at that time.  Of note he was treated with a DVT with xarelto in 08/2012.   Patient Active Problem List   Diagnosis Date Noted  . Lupus anticoagulant with hypercoagulable state 09/07/2012  . Hypokalemia 09/06/2012  . Elevated serum creatinine 09/05/2012  . DVT (deep venous thrombosis) 09/05/2012    Past Medical History  Diagnosis Date  . TBI (traumatic brain injury)   . PE (pulmonary embolism) 2007    Was inpatient for surgery at time.    Past Surgical History  Procedure Laterality Date  . Brain surgery    . Ivc filter      History  Substance Use Topics  . Smoking status: Current Every Day Smoker  . Smokeless tobacco: Not on file  . Alcohol Use: 3.6 oz/week    6 Cans of beer per week     Comment: 3 times a week    Family History  Problem Relation Age of Onset  . Clotting disorder Neg Hx     No Known Allergies  Medication list has been reviewed and updated.  Current Outpatient Prescriptions on File Prior to Visit  Medication Sig Dispense Refill  . Rivaroxaban (XARELTO) 15 MG TABS tablet  Take 1 tablet (15 mg total) by mouth 2 (two) times daily.  42 tablet  0   No current facility-administered medications on file prior to visit.    Review of Systems:  As per HPI- otherwise negative.   Physical Examination: Filed Vitals:   12/10/13 1553  BP: 138/82  Pulse: 83  Temp: 99.1 F (37.3 C)  Resp: 16   Filed Vitals:   12/10/13 1553  Height: 5' 6.5" (1.689 m)  Weight: 136 lb (61.689 kg)   Body mass index is 21.62 kg/(m^2). Ideal Body Weight: Weight in (lb) to have BMI = 25: 156.9  GEN: WDWN, NAD, Non-toxic, A & O x 3, tardive dyskinesia HEENT: Atraumatic, Normocephalic. Neck supple. No masses, No LAD. Ears and Nose: No external deformity. CV: RRR, No M/G/R. No JVD. No thrill. No extra heart sounds. PULM: CTA B, no  wheezes, crackles, rhonchi. No retractions. No resp. distress. No accessory muscle use. ABD: S, NT, ND. No rebound. No HSM. EXTR: No c/c/e NEURO Normal gait.  PSYCH: Normally interactive. Conversant. Not depressed or anxious appearing.  Calm demeanor.  On exam, his right leg is swollen about 1.5 inches greater than left at both calf and thigh.  He has a few petechiae on his calf.  The right leg is mildly tender and shows pitting edema distally.   Feet show normal perfusion.  He has deformity of the great toes bilaterally- they are abducted and cross over the other toes.  There are no lesions or ulcers on the feet however.     Assessment and Plan: Leg pain, right - Plan: Rivaroxaban (XARELTO) 15 MG TABS tablet  DVT of axillary vein, acute bilateral  [name] is here today with recurrent DVT- bilateral.  He may have an IVC filter but we do not know for sure.   Will treat with xarelto, which he has tolerated in the past.  Stressed the importance of starting this medication right away and being compliant with use.  Discussed his home and financial situation; if he does not feel like he has a stable place to stay or will not be able to pick up or afford his medications we can admit him to the hospital.  He declines admission at this time. Agreed to call me or have the pharmD call if he cannot afford his medication.  Plan for a recheck in one week.  Explained that we will use xarelto 15 BID for 3 weeks, then change to 20 mg a day for 6 months/ forever.  Will need heme- onc consultation  Signed Abbe Amsterdam, MD

## 2013-12-10 NOTE — Telephone Encounter (Signed)
Scheduled pt for a venous doppler today at 11 a.m. Tried to contact pt through his "driver" (he does not have a phone), but the driver did not answer and his VM is full. Will keep trying.

## 2013-12-10 NOTE — Progress Notes (Signed)
VASCULAR LAB PRELIMINARY  PRELIMINARY  PRELIMINARY  PRELIMINARY  Bilateral lower extremity venous duplex completed.    Preliminary report:  Positive for acute DVT of the right posterior tibial vein coursing through the popliteal , profunda, femoral and common femoral veins with a absence of flow. There are also several calf vein DVTs. There is no evidence of superficial thrombus except for the saphenofemoral junction. No evidence of a Baker's cyst. Protocol mandated that due to the DVT on the right the left would also be evaluated. Left Postive for an acute DVT cousring from the popliteal vein through the femoral vein and into the distal common femoral vein to the saphenofemoral junction. There is also diffuse areas of DVT of the posterior tibial vein. Minimal flow is noted throughout the leg. There is no evidence of superficial thrombosis or Baker's cyst  Vallarie Fei, RVS 12/10/2013, 2:22 PM

## 2013-12-12 ENCOUNTER — Telehealth: Payer: Self-pay | Admitting: Family Medicine

## 2013-12-13 NOTE — Telephone Encounter (Signed)
I have called a few times over the last few days to check on this pt.  unfortunately the home number was a wrong number,  And the cell number is now "disconnected" per automated message

## 2013-12-15 ENCOUNTER — Telehealth: Payer: Self-pay

## 2013-12-15 ENCOUNTER — Telehealth: Payer: Self-pay | Admitting: Radiology

## 2013-12-15 DIAGNOSIS — M79604 Pain in right leg: Secondary | ICD-10-CM

## 2013-12-15 MED ORDER — RIVAROXABAN 15 MG PO TABS: 15.0000 mg | ORAL_TABLET | Freq: Two times a day (BID) | ORAL | Status: DC

## 2013-12-15 NOTE — Telephone Encounter (Signed)
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

## 2013-12-15 NOTE — Telephone Encounter (Signed)
His friend Renato GailsCassandra Forbes- [phone] called with an update.  She reports that Antonio Fernandez did not get his rx filled because he could not afford it.  We are uncertain if his Medicare/ medicaid have been updated.  I did call his [facility] does have Antonio Fernandez having medicare. medicaid as of last year but no more recent information   Elonda HuskyCassandra will take Antonio Fernandez to [facility] and try again to fill the Xarelto.  However if they are not successful I recommended that he go to the ER and possibly be admitted to start treatment.  Social factors are interfering with his treatment.

## 2013-12-16 ENCOUNTER — Encounter (HOSPITAL_COMMUNITY): Payer: Self-pay | Admitting: Emergency Medicine

## 2013-12-16 ENCOUNTER — Emergency Department (HOSPITAL_COMMUNITY)
Admission: EM | Admit: 2013-12-16 | Discharge: 2013-12-17 | Disposition: A | Discharge status: Home / Self Care | Payer: Medicare Other | Attending: Emergency Medicine | Admitting: Emergency Medicine

## 2013-12-16 DIAGNOSIS — I82409 Acute embolism and thrombosis of unspecified deep veins of unspecified lower extremity: Secondary | ICD-10-CM | POA: Insufficient documentation

## 2013-12-16 DIAGNOSIS — Z5900 Homelessness unspecified: Secondary | ICD-10-CM | POA: Insufficient documentation

## 2013-12-16 DIAGNOSIS — Z86711 Personal history of pulmonary embolism: Secondary | ICD-10-CM | POA: Insufficient documentation

## 2013-12-16 DIAGNOSIS — F17200 Nicotine dependence, unspecified, uncomplicated: Secondary | ICD-10-CM | POA: Insufficient documentation

## 2013-12-16 DIAGNOSIS — Z87820 Personal history of traumatic brain injury: Secondary | ICD-10-CM | POA: Insufficient documentation

## 2013-12-16 NOTE — ED Notes (Signed)
Pt c/o pain in RLE.  Pt states was told by a doctor he had a blood clot in LLE.    Paperwork shows indicates doppler done on RLE on 12-10-13.  Pt states pain began in RLE two weeks ago. 3+ Edema noted to RLE.   Was told to start Charolotte CapuchinXarellto but was unable to pay $500 for prescription.  Pt also c/o R groin pain.  Pt denies chest pain or SOB

## 2013-12-16 NOTE — ED Notes (Signed)
Pt updated on delay; apologized. Revitaled

## 2013-12-17 DIAGNOSIS — M79609 Pain in unspecified limb: Secondary | ICD-10-CM | POA: Diagnosis not present

## 2013-12-17 MED ORDER — XARELTO VTE STARTER PACK 15 & 20 MG PO TBPK: 15.0000 mg | ORAL_TABLET | ORAL | Status: DC

## 2013-12-17 NOTE — Progress Notes (Addendum)
*  Preliminary Results* Bilateral lower extremity venous duplex completed There is evidence of extensive acute occlusive deep vein thrombosis involving the right saphenofemoral junction, right common femoral, right femoral, right profunda femoral, right popliteal, right posterior tibial, and right peroneal veins. There is also deep vein thrombosis noted in the left saphenofemoral junction, left femoral, left profunda femoral, left popliteal, and left peroneal veins.  Preliminary results discussed with Dr.Manly.  12/17/2013  Antonio FeyMichelle Lockie Fernandez, RVT, RDCS, RDMS   Previous study preliminary results dated 12/10/13:  Bilateral lower extremity venous duplex completed.  Preliminary report: Positive for acute DVT of the right posterior tibial vein coursing through the popliteal , profunda, femoral and common femoral veins with a absence of flow. There are also several calf vein DVTs. There is no evidence of superficial thrombus except for the saphenofemoral junction. No evidence of a Baker's cyst. Protocol mandated that due to the DVT on the right the left would also be evaluated. Left Postive for an acute DVT cousring from the popliteal vein through the femoral vein and into the distal common femoral vein to the saphenofemoral junction. There is also diffuse areas of DVT of the posterior tibial vein. Minimal flow is noted throughout the leg. There is no evidence of superficial thrombosis or Baker's cyst  Fernandez, Antonio, RVS  12/10/2013, 2:22 PM

## 2013-12-17 NOTE — Discharge Instructions (Signed)
Deep Vein Thrombosis A deep vein thrombosis (DVT) is a blood clot that develops in the deep, larger veins of the leg, arm, or pelvis. These are more dangerous than clots that might form in veins near the surface of the body. A DVT can lead to complications if the clot breaks off and travels in the bloodstream to the lungs.  A DVT can damage the valves in your leg veins, so that instead of flowing upward, the blood pools in the lower leg. This is called post-thrombotic syndrome, and it can result in pain, swelling, discoloration, and sores on the leg. CAUSES Usually, several things contribute to blood clots forming. Contributing factors include:  The flow of blood slows down.  The inside of the vein is damaged in some way.  You have a condition that makes blood clot more easily. RISK FACTORS Some people are more likely than others to develop blood clots. Risk factors include:   Older age, especially over 53 years of age.  Having a family history of blood clots or if you have already had a blot clot.  Having major or lengthy surgery. This is especially true for surgery on the hip, knee, or belly (abdomen). Hip surgery is particularly high risk.  Breaking a hip or leg.  Sitting or lying still for a long time. This includes long-distance travel, paralysis, or recovery from an illness or surgery.  Having cancer or cancer treatment.  Having a long, thin tube (catheter) placed inside a vein during a medical procedure.  Being overweight (obese).  Pregnancy and childbirth.  Hormone changes make the blood clot more easily during pregnancy.  The fetus puts pressure on the veins of the pelvis.  There is a risk of injury to veins during delivery or a caesarean. The risk is highest just after childbirth.  Medicines with the male hormone estrogen. This includes birth control pills and hormone replacement therapy.  Smoking.  Other circulation or heart problems.  SIGNS AND SYMPTOMS When  a clot forms, it can either partially or totally block the blood flow in that vein. Symptoms of a DVT can include:  Swelling of the leg or arm, especially if one side is much worse.  Warmth and redness of the leg or arm, especially if one side is much worse.  Pain in an arm or leg. If the clot is in the leg, symptoms may be more noticeable or worse when standing or walking. The symptoms of a DVT that has traveled to the lungs (pulmonary embolism, PE) usually start suddenly and include:  Shortness of breath.  Coughing.  Coughing up blood or blood-tinged phlegm.  Chest pain. The chest pain is often worse with deep breaths.  Rapid heartbeat. Anyone with these symptoms should get emergency medical treatment right away. Call your local emergency services (911 in the [place] if you have these symptoms. DIAGNOSIS If a DVT is suspected, your health care provider will take a full medical history and perform a physical exam. Tests that also may be required include:  Blood tests, including studies of the clotting properties of the blood.  Ultrasonography to see if you have clots in your legs or lungs.  X-rays to show the flow of blood when dye is injected into the veins (venography).  Studies of your lungs if you have any chest symptoms. PREVENTION  Exercise the legs regularly. Take a brisk 30-minute walk every day.  Maintain a weight that is appropriate for your height.  Avoid sitting or lying in bed  for long periods of time without moving your legs.  Women, particularly those over the age of 24 years, should consider the risks and benefits of taking estrogen medicines, including birth control pills.  Do not smoke, especially if you take estrogen medicines.  Long-distance travel can increase your risk of DVT. You should exercise your legs by walking or pumping the muscles every hour.  In-hospital prevention:  Many of the risk factors above relate to situations that exist with  hospitalization, either for illness, injury, or elective surgery.  Your health care provider will assess you for the need for venous thromboembolism prophylaxis when you are admitted to the hospital. If you are having surgery, your surgeon will assess you the day of or day after surgery.  Prevention may include medical and nonmedical measures. TREATMENT Once identified, a DVT can be treated. It can also be prevented in some circumstances. Once you have had a DVT, you may be at increased risk for a DVT in the future. The most common treatment for DVT is blood thinning (anticoagulant) medicine, which reduces the blood's tendency to clot. Anticoagulants can stop new blood clots from forming and stop old ones from growing. They cannot dissolve existing clots. Your body does this by itself over time. Anticoagulants can be given by mouth, by IV access, or by injection. Your health care provider will determine the best program for you. Other medicines or treatments that may be used are:  Heparin or related medicines (low molecular weight heparin) are usually the first treatment for a blood clot. They act quickly. However, they cannot be taken orally.  Heparin can cause a fall in a component of blood that stops bleeding and forms blood clots (platelets). You will be monitored with blood tests to be sure this does not occur.  Warfarin is an anticoagulant that can be swallowed. It takes a few days to start working, so usually heparin or related medicines are used in combination. Once warfarin is working, heparin is usually stopped.  Less commonly, clot dissolving drugs (thrombolytics) are used to dissolve a DVT. They carry a high risk of bleeding, so they are used mainly in severe cases, where your life or a limb is threatened.  Very rarely, a blood clot in the leg needs to be removed surgically.  If you are unable to take anticoagulants, your health care provider may arrange for you to have a filter placed  in a main vein in your abdomen. This filter prevents clots from traveling to your lungs. HOME CARE INSTRUCTIONS  Take all medicines prescribed by your health care provider. Only take over-the-counter or prescription medicines for pain, fever, or discomfort as directed by your health care provider.  Warfarin. Most people will continue taking warfarin after hospital discharge. Your health care provider will advise you on the length of treatment (usually 3 6 months, sometimes lifelong).  Too much and too little warfarin are both dangerous. Too much warfarin increases the risk of bleeding. Too little warfarin continues to allow the risk for blood clots. While taking warfarin, you will need to have regular blood tests to measure your blood clotting time. These blood tests usually include both the prothrombin time (PT) and international normalized ratio (INR) tests. The PT and INR results allow your health care provider to adjust your dose of warfarin. The dose can change for many reasons. It is critically important that you take warfarin exactly as prescribed, and that you have your PT and INR levels drawn exactly as directed.  Many foods, especially foods high in vitamin K, can interfere with warfarin and affect the PT and INR results. Foods high in vitamin K include spinach, kale, broccoli, cabbage, collard and turnip greens, brussel sprouts, peas, cauliflower, seaweed, and parsley as well as beef and pork liver, green tea, and soybean oil. You should eat a consistent amount of foods high in vitamin K. Avoid major changes in your diet, or notify your health care provider before changing your diet. Arrange a visit with a dietitian to answer your questions.  Many medicines can interfere with warfarin and affect the PT and INR results. You must tell your health care provider about any and all medicines you take. This includes all vitamins and supplements. Be especially cautious with aspirin and  anti-inflammatory medicines. Ask your health care provider before taking these. Do not take or discontinue any prescribed or over-the-counter medicine except on the advice of your health care provider or pharmacist.  Warfarin can have side effects, primarily excessive bruising or bleeding. You will need to hold pressure over cuts for longer than usual. Your health care provider or pharmacist will discuss other potential side effects.  Alcohol can change the body's ability to handle warfarin. It is best to avoid alcoholic drinks or consume only very small amounts while taking warfarin. Notify your health care provider if you change your alcohol intake.  Notify your dentist or other health care providers before procedures.  Activity. Ask your health care provider how soon you can go back to normal activities. It is important to stay active to prevent blood clots. If you are on anticoagulant medicine, avoid contact sports.  Exercise. It is very important to exercise. This is especially important while traveling, sitting, or standing for long periods of time. Exercise your legs by walking or by pumping the muscles frequently. Take frequent walks.  Compression stockings. These are tight elastic stockings that apply pressure to the lower legs. This pressure can help keep the blood in the legs from clotting. You may need to wear compression stockings at home to help prevent a DVT.  Do not smoke. If you smoke, quit. Ask your health care provider for help with quitting smoking.  Learn as much as you can about DVT. Knowing more about the condition should help you keep it from coming back.  Wear a medical alert bracelet or carry a medical alert card. SEEK MEDICAL CARE IF:  You notice a rapid heartbeat.  You feel weaker or more tired than usual.  You feel faint.  You notice increased bruising.  You feel your symptoms are not getting better in the time expected.  You believe you are having side  effects of medicine. SEEK IMMEDIATE MEDICAL CARE IF:  You have chest pain.  You have trouble breathing.  You have new or increased swelling or pain in one leg.  You cough up blood.  You notice blood in vomit, in a bowel movement, or in urine. MAKE SURE YOU:  Understand these instructions.  Will watch your condition.  Will get help right away if you are not doing well or get worse. Document Released: 09/23/2005 Document Revised: 07/14/2013 Document Reviewed: 05/31/2013 [facility] Patient Information 2014 [employer] [place].  RETURN TO THE ED FOR SHORTNESS OF BREATH OR CHEST PAIN.   CALL THE [facility] TO ESTABLISH A FOLLOW UP APPT.

## 2013-12-17 NOTE — ED Notes (Signed)
Patient transported to Doppler  

## 2013-12-17 NOTE — ED Provider Notes (Signed)
CSN: [id number]     Arrival date & time 12/16/13  56 History   First MD Initiated Contact with Patient 12/17/13 0038     Chief Complaint  Patient presents with  . Leg Pain     (Consider location/radiation/quality/duration/timing/severity/associated sxs/prior Treatment) HPI This patient is a 58 yo homeless man who is currently uninsured. He has a history of VTE. He presents with complaints of right leg pain x 2 weeks. 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 The patient was prescribed Xarelto. However, he says he took it to the pharmacy and said he could not get it filled because the cost was $500 and the patient is currently uninsured. Thus, he returns to the ED.   Pain is aching, worse with prolonged weight bearing, moderately severe, patient notes chronic edema of the right leg. His pain it radiates from the calf proximally. He denies SOB and chest pain.   Past Medical History  Diagnosis Date  . TBI (traumatic brain injury)   . PE (pulmonary embolism) 2007    Was inpatient for surgery at time.   Past Surgical History  Procedure Laterality Date  . Brain surgery    . Ivc filter     Family History  Problem Relation Age of Onset  . Clotting disorder Neg Hx    History  Substance Use Topics  . Smoking status: Current Every Day Smoker -- 0.15 packs/day    Types: Cigarettes  . Smokeless tobacco: Not on file  . Alcohol Use: 3.6 oz/week    6 Cans of beer per week     Comment: 1xweek    Review of Systems Ten point review of symptoms performed and is negative with the exception of symptoms noted above.    Allergies  Review of patient's allergies indicates no known allergies.  Home Medications   Current Outpatient Rx  Name  Route  Sig  Dispense  Refill  . Rivaroxaban (XARELTO) 15 MG TABS tablet   Oral   Take 1 tablet (15 mg total) by mouth 2 (two) times daily.   42 tablet   0    BP 127/74  Pulse 65   Temp(Src) 98.9 F (37.2 C) (Oral)  Resp 16  Ht 5' 8"  (1.727 m)  Wt 140 lb (63.504 kg)  BMI 21.29 kg/m2  SpO2 97% Physical Exam Gen: well developed and cachectic appearing Head: NCAT Eyes: PERL, EOMI Nose: no epistaixis or rhinorrhea Mouth/throat: mucosa is moist and pink Neck: normal to inspection Lungs: CTA B, no wheezing, rhonchi or rales CV: RRR, no murmur, extremities appear well perfused.  Abd: soft, notender, nondistended Back: no ttp, no cva ttp Skin: warm and dry Ext: normal to inspection, RLE: mild edema of the lower leg is noted, there is calf tenderness, good DP pulses, NVI.  Neuro: CN ii-xii grossly intact, no focal deficits Psyche; normal affect,  calm and cooperative.   ED Course  Procedures (including critical care time) Labs Review   MDM   We will discharge the patient with a Xarelto starter kit which includes a coupon/voucher for a free 30 days supply of the medication. He is referred to the [facility] to establish care and for refills.    Elyn Peers, MD 12/17/13 [phone]

## 2013-12-19 ENCOUNTER — Encounter: Payer: Self-pay | Admitting: Family Medicine

## 2013-12-19 DIAGNOSIS — I2699 Other pulmonary embolism without acute cor pulmonale: Secondary | ICD-10-CM | POA: Insufficient documentation

## 2013-12-20 NOTE — Telephone Encounter (Addendum)
PA was completed for Xarelto and was approved through 12/20/14. Notified pharmacy. Dr Annell Greeningopland FYI. I called the ph # on demographics and [name] answered and said that pt does not live at that # but she will try to get in touch w/pt and have him call. She does not know where he is. I do not have permission on HIPPA to call and speak w/ [name].

## 2015-09-04 DIAGNOSIS — I10 Essential (primary) hypertension: Secondary | ICD-10-CM | POA: Diagnosis not present

## 2015-09-04 DIAGNOSIS — I82409 Acute embolism and thrombosis of unspecified deep veins of unspecified lower extremity: Secondary | ICD-10-CM | POA: Diagnosis not present

## 2015-12-05 DIAGNOSIS — Z86718 Personal history of other venous thrombosis and embolism: Secondary | ICD-10-CM | POA: Diagnosis not present

## 2015-12-05 DIAGNOSIS — I10 Essential (primary) hypertension: Secondary | ICD-10-CM | POA: Diagnosis not present

## 2016-07-26 DIAGNOSIS — Z23 Encounter for immunization: Secondary | ICD-10-CM | POA: Diagnosis not present

## 2016-07-26 DIAGNOSIS — Z131 Encounter for screening for diabetes mellitus: Secondary | ICD-10-CM | POA: Diagnosis not present

## 2016-07-26 DIAGNOSIS — Z125 Encounter for screening for malignant neoplasm of prostate: Secondary | ICD-10-CM | POA: Diagnosis not present

## 2016-07-26 DIAGNOSIS — I82402 Acute embolism and thrombosis of unspecified deep veins of left lower extremity: Secondary | ICD-10-CM | POA: Diagnosis not present

## 2016-07-26 DIAGNOSIS — R030 Elevated blood-pressure reading, without diagnosis of hypertension: Secondary | ICD-10-CM | POA: Diagnosis not present

## 2016-07-26 DIAGNOSIS — Z79899 Other long term (current) drug therapy: Secondary | ICD-10-CM | POA: Diagnosis not present

## 2016-07-26 DIAGNOSIS — Z13220 Encounter for screening for lipoid disorders: Secondary | ICD-10-CM | POA: Diagnosis not present

## 2016-07-29 ENCOUNTER — Encounter (HOSPITAL_COMMUNITY): Payer: Medicaid Other

## 2016-07-29 ENCOUNTER — Other Ambulatory Visit (HOSPITAL_COMMUNITY): Payer: Self-pay | Admitting: Internal Medicine

## 2016-07-29 DIAGNOSIS — I82402 Acute embolism and thrombosis of unspecified deep veins of left lower extremity: Secondary | ICD-10-CM

## 2019-07-21 ENCOUNTER — Ambulatory Visit: Payer: Medicare Other | Admitting: Podiatry

## 2020-01-24 ENCOUNTER — Encounter: Payer: Self-pay | Admitting: Internal Medicine

## 2020-01-24 ENCOUNTER — Ambulatory Visit: Payer: Medicare Other | Attending: Internal Medicine | Admitting: Internal Medicine

## 2020-01-24 ENCOUNTER — Encounter: Payer: Self-pay | Admitting: Gastroenterology

## 2020-01-24 ENCOUNTER — Other Ambulatory Visit: Payer: Self-pay

## 2020-01-24 VITALS — BP 166/103 | HR 68 | Temp 98.2°F | Resp 16 | Ht 67.0 in | Wt 173.2 lb

## 2020-01-24 DIAGNOSIS — Z1211 Encounter for screening for malignant neoplasm of colon: Secondary | ICD-10-CM

## 2020-01-24 DIAGNOSIS — I10 Essential (primary) hypertension: Secondary | ICD-10-CM

## 2020-01-24 DIAGNOSIS — S069X1S Unspecified intracranial injury with loss of consciousness of 30 minutes or less, sequela: Secondary | ICD-10-CM

## 2020-01-24 DIAGNOSIS — H538 Other visual disturbances: Secondary | ICD-10-CM | POA: Diagnosis not present

## 2020-01-24 DIAGNOSIS — R269 Unspecified abnormalities of gait and mobility: Secondary | ICD-10-CM | POA: Diagnosis not present

## 2020-01-24 DIAGNOSIS — F17210 Nicotine dependence, cigarettes, uncomplicated: Secondary | ICD-10-CM | POA: Diagnosis not present

## 2020-01-24 DIAGNOSIS — Z87820 Personal history of traumatic brain injury: Secondary | ICD-10-CM | POA: Insufficient documentation

## 2020-01-24 DIAGNOSIS — Z86711 Personal history of pulmonary embolism: Secondary | ICD-10-CM | POA: Insufficient documentation

## 2020-01-24 DIAGNOSIS — M21619 Bunion of unspecified foot: Secondary | ICD-10-CM | POA: Diagnosis not present

## 2020-01-24 DIAGNOSIS — D6862 Lupus anticoagulant syndrome: Secondary | ICD-10-CM | POA: Diagnosis not present

## 2020-01-24 DIAGNOSIS — Z86718 Personal history of other venous thrombosis and embolism: Secondary | ICD-10-CM | POA: Insufficient documentation

## 2020-01-24 DIAGNOSIS — F17200 Nicotine dependence, unspecified, uncomplicated: Secondary | ICD-10-CM | POA: Insufficient documentation

## 2020-01-24 DIAGNOSIS — S069X9A Unspecified intracranial injury with loss of consciousness of unspecified duration, initial encounter: Secondary | ICD-10-CM | POA: Insufficient documentation

## 2020-01-24 DIAGNOSIS — G3281 Cerebellar ataxia in diseases classified elsewhere: Secondary | ICD-10-CM

## 2020-01-24 DIAGNOSIS — G119 Hereditary ataxia, unspecified: Secondary | ICD-10-CM | POA: Insufficient documentation

## 2020-01-24 DIAGNOSIS — L602 Onychogryphosis: Secondary | ICD-10-CM | POA: Insufficient documentation

## 2020-01-24 DIAGNOSIS — S069XAA Unspecified intracranial injury with loss of consciousness status unknown, initial encounter: Secondary | ICD-10-CM | POA: Insufficient documentation

## 2020-01-24 MED ORDER — AMLODIPINE BESYLATE 10 MG PO TABS: 10.0000 mg | ORAL_TABLET | Freq: Every day | ORAL | 3 refills | Status: DC

## 2020-01-24 MED ORDER — NICOTINE 7 MG/24HR TD PT24: 7.0000 mg | MEDICATED_PATCH | Freq: Every day | TRANSDERMAL | 0 refills | Status: DC
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
## 2020-01-24 NOTE — Patient Instructions (Signed)
Please sign a release for [place] to get your medical records from [facility].

## 2020-01-25 LAB — COMPREHENSIVE METABOLIC PANEL
ALT: 17 IU/L (ref 0–44)
AST: 20 IU/L (ref 0–40)
Albumin/Globulin Ratio: 1.3 (ref 1.2–2.2)
Albumin: 4.5 g/dL (ref 3.8–4.8)
Alkaline Phosphatase: 73 IU/L (ref 39–117)
BUN/Creatinine Ratio: 8 — ABNORMAL LOW (ref 10–24)
BUN: 10 mg/dL (ref 8–27)
Bilirubin Total: 0.5 mg/dL (ref 0.0–1.2)
CO2: 22 mmol/L (ref 20–29)
Calcium: 9.7 mg/dL (ref 8.6–10.2)
Chloride: 101 mmol/L (ref 96–106)
Creatinine, Ser: 1.21 mg/dL (ref 0.76–1.27)
GFR calc Af Amer: 72 mL/min/{1.73_m2} (ref >59)
GFR calc non Af Amer: 62 mL/min/{1.73_m2} (ref >59)
Globulin, Total: 3.5 g/dL (ref 1.5–4.5)
Glucose: 83 mg/dL (ref 65–99)
Potassium: 4.7 mmol/L (ref 3.5–5.2)
Sodium: 138 mmol/L (ref 134–144)
Total Protein: 8 g/dL (ref 6.0–8.5)

## 2020-01-25 LAB — LIPID PANEL
Chol/HDL Ratio: 5.9 ratio — ABNORMAL HIGH (ref 0.0–5.0)
Cholesterol, Total: 200 mg/dL — ABNORMAL HIGH (ref 100–199)
HDL: 34 mg/dL — ABNORMAL LOW (ref >39)
LDL Chol Calc (NIH): 151 mg/dL — ABNORMAL HIGH (ref 0–99)
Triglycerides: 84 mg/dL (ref 0–149)
VLDL Cholesterol Cal: 15 mg/dL (ref 5–40)

## 2020-01-25 LAB — CBC
Hematocrit: 45.7 % (ref 37.5–51.0)
Hemoglobin: 15.3 g/dL (ref 13.0–17.7)
MCH: 28.4 pg (ref 26.6–33.0)
MCHC: 33.5 g/dL (ref 31.5–35.7)
MCV: 85 fL (ref 79–97)
Platelets: 232 10*3/uL (ref 150–450)
RBC: 5.38 x10E6/uL (ref 4.14–5.80)
RDW: 13.1 % (ref 11.6–15.4)
WBC: 4.9 10*3/uL (ref 3.4–10.8)

## 2020-01-26 ENCOUNTER — Other Ambulatory Visit: Payer: Self-pay | Admitting: Internal Medicine

## 2020-01-26 ENCOUNTER — Other Ambulatory Visit: Payer: Self-pay

## 2020-01-26 ENCOUNTER — Ambulatory Visit (HOSPITAL_COMMUNITY)
Admission: RE | Admit: 2020-01-26 | Discharge: 2020-01-26 | Disposition: A | Discharge status: Home / Self Care | Payer: Medicare Other | Source: Ambulatory Visit | Attending: Internal Medicine | Admitting: Internal Medicine

## 2020-01-26 ENCOUNTER — Telehealth: Payer: Self-pay

## 2020-01-26 DIAGNOSIS — G3281 Cerebellar ataxia in diseases classified elsewhere: Secondary | ICD-10-CM | POA: Diagnosis not present

## 2020-01-26 MED ORDER — ATORVASTATIN CALCIUM 20 MG PO TABS: 20.0000 mg | ORAL_TABLET | Freq: Every day | ORAL | 3 refills | Status: DC

## 2020-01-26 NOTE — Telephone Encounter (Signed)
Contacted pt and caregiver to go over lab results. Both pt and the caregiver understand and doesn't have any questions or concerns

## 2020-01-26 NOTE — Progress Notes (Signed)
Let patient and his caregiver know that his blood count is normal meaning no anemia.  His kidney and liver function tests are okay.  His LDL cholesterol is at 151 with goal being less than 100.  This increases his risk for heart attack and strokes.  I recommend starting a medication called atorvastatin to help lower cholesterol and in doing so will help to lower his risk for heart attacks and strokes.  I have sent the prescription to his pharmacy. The rest is for my information. The 10-year ASCVD risk score Denman George DC Montez Hageman., et al., 2013) is: 44.2%   Values used to calculate the score:     Age: 21 years     Sex: Male     Is Non-Hispanic African American: Yes     Diabetic: No     Tobacco smoker: Yes     Systolic Blood Pressure: 166 mmHg     Is BP treated: Yes     HDL Cholesterol: 34 mg/dL     Total Cholesterol: 200 mg/dL

## 2020-01-28 ENCOUNTER — Telehealth: Payer: Self-pay | Admitting: Internal Medicine

## 2020-01-28 ENCOUNTER — Other Ambulatory Visit: Payer: Self-pay | Admitting: Internal Medicine

## 2020-01-28 DIAGNOSIS — I6381 Other cerebral infarction due to occlusion or stenosis of small artery: Secondary | ICD-10-CM

## 2020-01-28 DIAGNOSIS — I639 Cerebral infarction, unspecified: Secondary | ICD-10-CM

## 2020-01-28 MED ORDER — ASPIRIN EC 81 MG PO TBEC: 81.0000 mg | DELAYED_RELEASE_TABLET | Freq: Every day | ORAL | 1 refills | Status: DC

## 2020-01-28 NOTE — Progress Notes (Signed)
Let patient and his caregiver know that the CAT scan of his head shows that he has had a stroke that appears old and probably another 1 that may have been more recent.  Blood pressure, cholesterol control and stop smoking are very important to prevent future strokes.  He also needs to be taking a baby aspirin every day which is an 81 mg aspirin.  I will send that prescription to his pharmacy.  He will also need further work-up of stroke to include an ultrasound of his neck to check for any blockages of blood vessels in his neck and the study on his heart: Echocardiogram.  I have placed these orders.  Please schedule for him.

## 2020-01-28 NOTE — Telephone Encounter (Signed)
Contacted pt and caregiver to go over CT results and the ABN form. Pt didn't answer lvm asking for them to give me a call back at their earliest convenience

## 2020-01-31 NOTE — Telephone Encounter (Signed)
Antonio Fernandez spoke with caregiver and provided results and went over ABN. Caregiver didn't have any questions or concerns

## 2020-02-01 ENCOUNTER — Other Ambulatory Visit: Payer: Self-pay | Admitting: Pharmacist

## 2020-02-01 DIAGNOSIS — I6381 Other cerebral infarction due to occlusion or stenosis of small artery: Secondary | ICD-10-CM

## 2020-02-01 DIAGNOSIS — F17200 Nicotine dependence, unspecified, uncomplicated: Secondary | ICD-10-CM

## 2020-02-01 DIAGNOSIS — I639 Cerebral infarction, unspecified: Secondary | ICD-10-CM

## 2020-02-01 DIAGNOSIS — I10 Essential (primary) hypertension: Secondary | ICD-10-CM

## 2020-02-01 MED ORDER — ASPIRIN EC 81 MG PO TBEC: 81.0000 mg | DELAYED_RELEASE_TABLET | Freq: Every day | ORAL | 1 refills | Status: AC

## 2020-02-01 MED ORDER — NICOTINE 7 MG/24HR TD PT24: 7.0000 mg | MEDICATED_PATCH | Freq: Every day | TRANSDERMAL | 0 refills | Status: DC

## 2020-02-01 MED ORDER — AMLODIPINE BESYLATE 10 MG PO TABS: 10.0000 mg | ORAL_TABLET | Freq: Every day | ORAL | 3 refills | Status: DC

## 2020-02-01 MED ORDER — ATORVASTATIN CALCIUM 20 MG PO TABS: 20.0000 mg | ORAL_TABLET | Freq: Every day | ORAL | 3 refills | Status: DC

## 2020-02-02 NOTE — Addendum Note (Signed)
Addended by: Jonah Blue B on: 02/02/2020 12:25 PM   Modules accepted: Orders

## 2020-02-07 ENCOUNTER — Ambulatory Visit (HOSPITAL_COMMUNITY)
Admission: RE | Admit: 2020-02-07 | Discharge: 2020-02-07 | Disposition: A | Discharge status: Home / Self Care | Payer: Medicare Other | Source: Ambulatory Visit | Attending: Internal Medicine | Admitting: Internal Medicine

## 2020-02-07 ENCOUNTER — Other Ambulatory Visit: Payer: Self-pay

## 2020-02-07 DIAGNOSIS — I10 Essential (primary) hypertension: Secondary | ICD-10-CM | POA: Diagnosis not present

## 2020-02-07 DIAGNOSIS — I639 Cerebral infarction, unspecified: Secondary | ICD-10-CM | POA: Insufficient documentation

## 2020-02-07 DIAGNOSIS — F17200 Nicotine dependence, unspecified, uncomplicated: Secondary | ICD-10-CM | POA: Diagnosis not present

## 2020-02-07 DIAGNOSIS — I6381 Other cerebral infarction due to occlusion or stenosis of small artery: Secondary | ICD-10-CM

## 2020-02-07 DIAGNOSIS — Z86711 Personal history of pulmonary embolism: Secondary | ICD-10-CM | POA: Diagnosis not present

## 2020-02-07 DIAGNOSIS — I358 Other nonrheumatic aortic valve disorders: Secondary | ICD-10-CM | POA: Insufficient documentation

## 2020-02-07 NOTE — Progress Notes (Signed)
  Echocardiogram 2D Echocardiogram has been performed.  Antonio Fernandez Sw[place] 02/07/2020, 1:44 PM

## 2020-02-09 ENCOUNTER — Telehealth: Payer: Self-pay

## 2020-02-09 NOTE — Telephone Encounter (Signed)
Contacted pt and caregiver to go over echo results was unable to reach them or lvm due to vm being full

## 2020-02-09 NOTE — Progress Notes (Signed)
Let patient and his caregiver know that the study done on his heart called the echocardiogram revealed that he has good heart function except the heart muscle may be a little stiff likely due to previously uncontrolled blood pressure.  Make sure that he takes his blood pressure medicine amlodipine every day as prescribed. I had also ordered carotid ultrasound to check for any blockages in the blood vessels in his neck.  I do not see that that has been scheduled.  Please schedule for him and provide them with the date and time.

## 2020-02-14 ENCOUNTER — Telehealth: Payer: Self-pay | Admitting: *Deleted

## 2020-02-14 NOTE — Telephone Encounter (Signed)
I think it would be more prudent to wait on his colonoscopy. 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 TTE looks ok. Scheduling for carotid doppler as well. I would recommend completion of that w/u first and f/u with his PCM. If the w/u is negative and no changes to therapy (aside from the ASA 81 mg recently started), then ok for colonoscopy. Otherwise, reasonable to schedule OV first to discuss appropriate timing.

## 2020-02-14 NOTE — Telephone Encounter (Signed)
I called the phone number listed and the male that answered the phone was not the patient and states I have the wrong number when I asked for [facility].   Mailed letter requesting the patient contact our office regarding Dr's recommendations. Colonoscopy cancelled at this time.

## 2020-02-14 NOTE — Telephone Encounter (Signed)
Patient is referred to [place] for a direct screening colonoscopy on 03/07/20 at [facility]. Please review this patient's chart. He recently had a CT scan, ?recent stroke. Patient is not currently on a blood thinner. 2021 echo 55-60% no AVS. Ok for [facility]? Please advise. Thank you, Greycen Felter pv

## 2020-02-15 ENCOUNTER — Ambulatory Visit (HOSPITAL_COMMUNITY): Admission: RE | Admit: 2020-02-15 | Payer: Medicare Other | Source: Ambulatory Visit

## 2020-02-17 NOTE — Telephone Encounter (Signed)
I called the number listed no answer, left a message for "monquie" to call us back.

## 2020-02-23 ENCOUNTER — Encounter: Payer: Self-pay | Admitting: Internal Medicine

## 2020-02-23 DIAGNOSIS — H40009 Preglaucoma, unspecified, unspecified eye: Secondary | ICD-10-CM | POA: Insufficient documentation

## 2020-02-23 DIAGNOSIS — H3562 Retinal hemorrhage, left eye: Secondary | ICD-10-CM

## 2020-02-23 DIAGNOSIS — H40003 Preglaucoma, unspecified, bilateral: Secondary | ICD-10-CM

## 2020-02-23 DIAGNOSIS — H35033 Hypertensive retinopathy, bilateral: Secondary | ICD-10-CM

## 2020-02-23 NOTE — Progress Notes (Signed)
I received note from [facility].  Patient seen 02/16/2020.  Patient diagnosed with glaucoma suspect, new isolated retinal hemorrhage OS likely hypertensive related, new hypertensive retinopathy OU, new arterial sclerosis OU, new cortical age-related cataract OU.

## 2020-02-28 ENCOUNTER — Ambulatory Visit (INDEPENDENT_AMBULATORY_CARE_PROVIDER_SITE_OTHER): Payer: Medicare Other

## 2020-02-28 ENCOUNTER — Ambulatory Visit (INDEPENDENT_AMBULATORY_CARE_PROVIDER_SITE_OTHER): Payer: Medicare Other | Admitting: Podiatry

## 2020-02-28 ENCOUNTER — Other Ambulatory Visit: Payer: Self-pay

## 2020-02-28 VITALS — Temp 97.3°F

## 2020-02-28 DIAGNOSIS — M21619 Bunion of unspecified foot: Secondary | ICD-10-CM

## 2020-02-28 DIAGNOSIS — M21611 Bunion of right foot: Secondary | ICD-10-CM

## 2020-02-28 DIAGNOSIS — M21612 Bunion of left foot: Secondary | ICD-10-CM

## 2020-03-02 NOTE — Progress Notes (Signed)
   Subjective: 64 y.o. male presents today as a new patient with a chief complaint of bilateral bunion deformities that have been present for the past several years. He states he wore shoes that were too small when he was a child. He denies any pain but states the bunions make him walk differently. He has not had any treatment for the symptoms. Patient is here for further evaluation and treatment.   Past Medical History:  Diagnosis Date  . PE (pulmonary embolism) 2007   Was inpatient for surgery at time.  Marland Kitchen TBI (traumatic brain injury) (HCC)       Objective: Physical Exam General: The patient is alert and oriented x3 in no acute distress.  Dermatology: Skin is cool, dry and supple bilateral lower extremities. Negative for open lesions or macerations.  Vascular: Palpable pedal pulses bilaterally. No edema or erythema noted. Capillary refill within normal limits.  Neurological: Epicritic and protective threshold grossly intact bilaterally.   Musculoskeletal Exam: Clinical evidence of bunion deformity noted to the respective foot. There is no pain on palpation range of motion of the first MPJ. Lateral deviation of the hallux noted consistent with hallux abductovalgus.  Radiographic Exam: Increased intermetatarsal angle greater than 15 with a hallux abductus angle greater than 30 noted on AP view. Moderate degenerative changes noted within the first MPJ.  Assessment: 1. HAV w/ bunion deformity bilateral - asymptomatic     Plan of Care:  1. Patient was evaluated. X-Rays reviewed. 2. Recommended good shoe gear.  3. May resume full activity with no restrictions.  4. Return to clinic as needed.    Felecia Shelling, DPM [facility]  Dr. Felecia Shelling, DPM    [address]                                        [place], [place] [address]                Office [phone]  Fax [phone]

## 2020-03-07 ENCOUNTER — Encounter: Payer: Medicare Other | Admitting: Gastroenterology

## 2021-06-14 ENCOUNTER — Ambulatory Visit: Payer: Medicare Other | Admitting: Internal Medicine

## 2021-07-17 ENCOUNTER — Ambulatory Visit: Payer: Medicare Other | Admitting: Internal Medicine

## 2021-11-06 ENCOUNTER — Ambulatory Visit: Payer: Medicare Other | Admitting: Internal Medicine

## 2021-11-23 ENCOUNTER — Inpatient Hospital Stay (HOSPITAL_BASED_OUTPATIENT_CLINIC_OR_DEPARTMENT_OTHER)
Admission: EM | Admit: 2021-11-23 | Discharge: 2021-11-26 | DRG: 871 | Disposition: A | Discharge status: Home / Self Care | Payer: Medicare Other | Attending: Internal Medicine | Admitting: Internal Medicine

## 2021-11-23 ENCOUNTER — Emergency Department (HOSPITAL_BASED_OUTPATIENT_CLINIC_OR_DEPARTMENT_OTHER): Payer: Medicare Other

## 2021-11-23 ENCOUNTER — Encounter (HOSPITAL_BASED_OUTPATIENT_CLINIC_OR_DEPARTMENT_OTHER): Payer: Self-pay | Admitting: Emergency Medicine

## 2021-11-23 ENCOUNTER — Other Ambulatory Visit: Payer: Self-pay

## 2021-11-23 DIAGNOSIS — Z86711 Personal history of pulmonary embolism: Secondary | ICD-10-CM

## 2021-11-23 DIAGNOSIS — Z23 Encounter for immunization: Secondary | ICD-10-CM

## 2021-11-23 DIAGNOSIS — A419 Sepsis, unspecified organism: Secondary | ICD-10-CM

## 2021-11-23 DIAGNOSIS — R6520 Severe sepsis without septic shock: Secondary | ICD-10-CM | POA: Diagnosis present

## 2021-11-23 DIAGNOSIS — J9601 Acute respiratory failure with hypoxia: Secondary | ICD-10-CM

## 2021-11-23 DIAGNOSIS — A4189 Other specified sepsis: Principal | ICD-10-CM | POA: Diagnosis present

## 2021-11-23 DIAGNOSIS — E785 Hyperlipidemia, unspecified: Secondary | ICD-10-CM | POA: Diagnosis present

## 2021-11-23 DIAGNOSIS — Z79899 Other long term (current) drug therapy: Secondary | ICD-10-CM

## 2021-11-23 DIAGNOSIS — F17210 Nicotine dependence, cigarettes, uncomplicated: Secondary | ICD-10-CM | POA: Diagnosis present

## 2021-11-23 DIAGNOSIS — U071 COVID-19: Principal | ICD-10-CM | POA: Diagnosis present

## 2021-11-23 DIAGNOSIS — I10 Essential (primary) hypertension: Secondary | ICD-10-CM | POA: Diagnosis present

## 2021-11-23 DIAGNOSIS — Z7982 Long term (current) use of aspirin: Secondary | ICD-10-CM

## 2021-11-23 DIAGNOSIS — R778 Other specified abnormalities of plasma proteins: Secondary | ICD-10-CM

## 2021-11-23 LAB — RESP PANEL BY RT-PCR (FLU A&B, COVID) ARPGX2
Influenza A by PCR: NEGATIVE
Influenza B by PCR: NEGATIVE
SARS Coronavirus 2 by RT PCR: POSITIVE — AB

## 2021-11-23 MED ORDER — DEXAMETHASONE 10 MG/ML FOR PEDIATRIC ORAL USE
10.0000 mg | Freq: Once | INTRAMUSCULAR | Status: AC
  Administered 2021-11-23: 10 mg via ORAL
  Filled 2021-11-23: qty 1

## 2021-11-23 MED ORDER — KETOROLAC TROMETHAMINE 15 MG/ML IJ SOLN
15.0000 mg | Freq: Once | INTRAMUSCULAR | Status: AC
  Administered 2021-11-23: 15 mg via INTRAVENOUS
  Filled 2021-11-23: qty 1

## 2021-11-23 MED ORDER — ACETAMINOPHEN 325 MG PO TABS
650.0000 mg | ORAL_TABLET | Freq: Once | ORAL | Status: AC
  Administered 2021-11-23: 650 mg via ORAL
  Filled 2021-11-23: qty 2

## 2021-11-23 MED ORDER — SODIUM CHLORIDE 0.9 % IV SOLN: Freq: Once | INTRAVENOUS | Status: AC

## 2021-11-23 NOTE — ED Provider Notes (Signed)
[facility] EMERGENCY DEPT Provider Note   CSN: [id number] Arrival date & time: 11/23/21  2104     History  Chief Complaint  Patient presents with   Sore Throat   Cough    Antonio Fernandez is a 66 y.o. male.  Patient is a 66 yo male with pmh of TBI presenting for throat pain and coughing. Pt admits to symptoms that started yesterday. Exposed to someone with Covid. Temp of 101.84F on arrival. Denies any nausea, vomiting, or diarrhea.   The history is provided by the patient. No language interpreter was used.  Sore Throat Associated symptoms include shortness of breath. Pertinent negatives include no chest pain and no abdominal pain.  Cough Associated symptoms: fever and shortness of breath   Associated symptoms: no chest pain, no chills, no ear pain, no rash and no sore throat       Home Medications Prior to Admission medications   Medication Sig Start Date End Date Taking? Authorizing Provider  amLODipine (NORVASC) 10 MG tablet Take 1 tablet (10 mg total) by mouth daily. 02/01/20   Marcine Matar, MD  aspirin EC 81 MG tablet Take 1 tablet (81 mg total) by mouth daily. 02/01/20   Marcine Matar, MD  atorvastatin (LIPITOR) 20 MG tablet Take 1 tablet (20 mg total) by mouth at bedtime. 02/01/20   Marcine Matar, MD  nicotine (NICODERM CQ - DOSED IN MG/24 HR) 7 mg/24hr patch Place 1 patch (7 mg total) onto the skin daily. 02/01/20   Marcine Matar, MD      Allergies    Patient has no known allergies.    Review of Systems   Review of Systems  Constitutional:  Positive for fever. Negative for chills.  HENT:  Negative for ear pain and sore throat.   Eyes:  Negative for pain and visual disturbance.  Respiratory:  Positive for cough and shortness of breath.   Cardiovascular:  Negative for chest pain and palpitations.  Gastrointestinal:  Negative for abdominal pain and vomiting.  Genitourinary:  Negative for dysuria and hematuria.  Musculoskeletal:   Negative for arthralgias and back pain.  Skin:  Negative for color change and rash.  Neurological:  Negative for seizures and syncope.  All other systems reviewed and are negative.  Physical Exam Updated Vital Signs BP 136/82    Pulse 93    Temp (!) 101.7 F (38.7 C)    Resp (!) 22    Ht 5\' 7"  (1.702 m)    Wt 77.1 kg    SpO2 93%    BMI 26.63 kg/m  Physical Exam Vitals and nursing note reviewed.  Constitutional:      General: He is not in acute distress.    Appearance: He is well-developed.  HENT:     Head: Normocephalic and atraumatic.  Eyes:     Conjunctiva/sclera: Conjunctivae normal.  Cardiovascular:     Rate and Rhythm: Normal rate and regular rhythm.     Heart sounds: No murmur heard. Pulmonary:     Effort: Pulmonary effort is normal. No respiratory distress.     Breath sounds: No decreased air movement. Rhonchi present.  Abdominal:     Palpations: Abdomen is soft.     Tenderness: There is no abdominal tenderness.  Musculoskeletal:        General: No swelling.     Cervical back: Neck supple.  Skin:    General: Skin is warm and dry.     Capillary Refill: Capillary refill  takes less than 2 seconds.  Neurological:     Mental Status: He is alert.  Psychiatric:        Mood and Affect: Mood normal.    ED Results / Procedures / Treatments   Labs (all labs ordered are listed, but only abnormal results are displayed) Labs Reviewed  RESP PANEL BY RT-PCR (FLU A&B, COVID) ARPGX2 - Abnormal; Notable for the following components:      Result Value   SARS Coronavirus 2 by RT PCR POSITIVE (*)    All other components within normal limits    EKG None  Radiology DG Chest 1 View  Result Date: 11/23/2021 CLINICAL DATA:  Sore throat and cough. EXAM: CHEST  1 VIEW COMPARISON:  None. FINDINGS: Decreased lung volumes are seen, likely secondary to the degree of patient inspiration. Mildly increased lung markings are noted with very mild bibasilar atelectasis, left greater than  right. No pleural effusion or pneumothorax is identified. The heart size and mediastinal contours are within normal limits. The visualized skeletal structures are unremarkable. IMPRESSION: Low lung volumes with very mild bibasilar atelectasis, left greater than right. Electronically Signed   By: Virgina Norfolk M.D.   On: 11/23/2021 21:51    Procedures Procedures    Medications Ordered in ED Medications  acetaminophen (TYLENOL) tablet 650 mg (650 mg Oral Given 11/23/21 2141)    ED Course/ Medical Decision Making/ A&P                           Medical Decision Making Amount and/or Complexity of Data Reviewed Labs: ordered. Radiology: ordered. ECG/medicine tests: ordered.  Risk OTC drugs. Prescription drug management. Decision regarding hospitalization.   10:46 PM  66 yo male with pmh of TBI presenting for throat pain and coughing. Pt admits to symptoms that started yesterday. Exposed to someone with Covid. On exam pt is Aox3, no acute distress, febrile at 101.70F, tachypneic, with hypoxic with oxygen sat of 89%. [place] 2 L placed. Covid positive. CXR demonstrates no acute process. Decadron given. Toradol given for fever.   ECG demonstrates sinus rhythm. No st segment elevation or depression. Stable t waves. troponin pending.  ED covid admitting labs ordered.   Pt signed out to oncoming provider. Plan for admission after labs back.         Final Clinical Impression(s) / ED Diagnoses Final diagnoses:  COVID  Sepsis, due to unspecified organism, unspecified whether acute organ dysfunction present [facility])  Acute respiratory failure with hypoxia [facility])    Rx / DC Orders ED Discharge Orders     None         Lianne Cure, DO [id number]

## 2021-11-23 NOTE — ED Triage Notes (Signed)
°  Patient BIB EMS for sore throat and cough that started yesterday.  Was exposed to someone with cold like symptoms.  Patient febrile on arrival 101.7  Pain 5/10, soreness/irritation in throat.

## 2021-11-23 NOTE — ED Notes (Signed)
RT and MD ambulated pt in room on RA w/sats of 88% during ambulation, post ambulation pts sats recovered to 90%. Pt BS Bilat rhonchi/coarse crackles. RT suctioned airway with minimal return. Sputum return clear bloody. Pt states his throat is/was very sore. MD aware. RT will continue to monitor.

## 2021-11-23 NOTE — ED Notes (Signed)
Pt placed on 2L [place] due to O2 saturation of 87%.

## 2021-11-24 ENCOUNTER — Inpatient Hospital Stay (HOSPITAL_COMMUNITY): Payer: Medicare Other

## 2021-11-24 ENCOUNTER — Encounter (HOSPITAL_COMMUNITY): Payer: Self-pay | Admitting: Internal Medicine

## 2021-11-24 DIAGNOSIS — F17210 Nicotine dependence, cigarettes, uncomplicated: Secondary | ICD-10-CM | POA: Diagnosis present

## 2021-11-24 DIAGNOSIS — J9601 Acute respiratory failure with hypoxia: Secondary | ICD-10-CM | POA: Diagnosis present

## 2021-11-24 DIAGNOSIS — Z7982 Long term (current) use of aspirin: Secondary | ICD-10-CM | POA: Diagnosis not present

## 2021-11-24 DIAGNOSIS — I10 Essential (primary) hypertension: Secondary | ICD-10-CM | POA: Diagnosis present

## 2021-11-24 DIAGNOSIS — R6520 Severe sepsis without septic shock: Secondary | ICD-10-CM | POA: Diagnosis present

## 2021-11-24 DIAGNOSIS — U071 COVID-19: Secondary | ICD-10-CM

## 2021-11-24 DIAGNOSIS — A419 Sepsis, unspecified organism: Secondary | ICD-10-CM | POA: Diagnosis not present

## 2021-11-24 DIAGNOSIS — E785 Hyperlipidemia, unspecified: Secondary | ICD-10-CM | POA: Diagnosis present

## 2021-11-24 DIAGNOSIS — Z79899 Other long term (current) drug therapy: Secondary | ICD-10-CM | POA: Diagnosis not present

## 2021-11-24 DIAGNOSIS — Z23 Encounter for immunization: Secondary | ICD-10-CM | POA: Diagnosis present

## 2021-11-24 DIAGNOSIS — R778 Other specified abnormalities of plasma proteins: Secondary | ICD-10-CM

## 2021-11-24 DIAGNOSIS — Z86711 Personal history of pulmonary embolism: Secondary | ICD-10-CM | POA: Diagnosis not present

## 2021-11-24 DIAGNOSIS — A4189 Other specified sepsis: Secondary | ICD-10-CM | POA: Diagnosis present

## 2021-11-24 LAB — CBC WITH DIFFERENTIAL/PLATELET
Abs Immature Granulocytes: 0.01 10*3/uL (ref 0.00–0.07)
Abs Immature Granulocytes: 0.03 10*3/uL (ref 0.00–0.07)
Basophils Absolute: 0 10*3/uL (ref 0.0–0.1)
Basophils Absolute: 0 10*3/uL (ref 0.0–0.1)
Basophils Relative: 0 %
Basophils Relative: 0 %
Eosinophils Absolute: 0 10*3/uL (ref 0.0–0.5)
Eosinophils Absolute: 0 10*3/uL (ref 0.0–0.5)
Eosinophils Relative: 0 %
Eosinophils Relative: 0 %
HCT: 40.1 % (ref 39.0–52.0)
HCT: 44.8 % (ref 39.0–52.0)
Hemoglobin: 12.8 g/dL — ABNORMAL LOW (ref 13.0–17.0)
Hemoglobin: 14 g/dL (ref 13.0–17.0)
Immature Granulocytes: 0 %
Immature Granulocytes: 0 %
Lymphocytes Relative: 12 %
Lymphocytes Relative: 15 %
Lymphs Abs: 1 10*3/uL (ref 0.7–4.0)
Lymphs Abs: 1.1 10*3/uL (ref 0.7–4.0)
MCH: 28 pg (ref 26.0–34.0)
MCH: 28.2 pg (ref 26.0–34.0)
MCHC: 31.3 g/dL (ref 30.0–36.0)
MCHC: 31.9 g/dL (ref 30.0–36.0)
MCV: 87.7 fL (ref 80.0–100.0)
MCV: 90.3 fL (ref 80.0–100.0)
Monocytes Absolute: 0.5 10*3/uL (ref 0.1–1.0)
Monocytes Absolute: 1 10*3/uL (ref 0.1–1.0)
Monocytes Relative: 14 %
Monocytes Relative: 6 %
Neutro Abs: 5 10*3/uL (ref 1.7–7.7)
Neutro Abs: 6.5 10*3/uL (ref 1.7–7.7)
Neutrophils Relative %: 71 %
Neutrophils Relative %: 82 %
Platelet Morphology: NORMAL
Platelets: 113 10*3/uL — ABNORMAL LOW (ref 150–400)
Platelets: 126 10*3/uL — ABNORMAL LOW (ref 150–400)
RBC: 4.57 MIL/uL (ref 4.22–5.81)
RBC: 4.96 MIL/uL (ref 4.22–5.81)
RDW: 14.9 % (ref 11.5–15.5)
RDW: 15.2 % (ref 11.5–15.5)
WBC: 7.1 10*3/uL (ref 4.0–10.5)
WBC: 8.1 10*3/uL (ref 4.0–10.5)
nRBC: 0 % (ref 0.0–0.2)
nRBC: 0 % (ref 0.0–0.2)

## 2021-11-24 LAB — COMPREHENSIVE METABOLIC PANEL
ALT: 18 U/L (ref 0–44)
ALT: 26 U/L (ref 0–44)
AST: 35 U/L (ref 15–41)
AST: 60 U/L — ABNORMAL HIGH (ref 15–41)
Albumin: 3.5 g/dL (ref 3.5–5.0)
Albumin: 3.7 g/dL (ref 3.5–5.0)
Alkaline Phosphatase: 40 U/L (ref 38–126)
Alkaline Phosphatase: 44 U/L (ref 38–126)
Anion gap: 11 (ref 5–15)
Anion gap: 9 (ref 5–15)
BUN: 14 mg/dL (ref 8–23)
BUN: 15 mg/dL (ref 8–23)
CO2: 23 mmol/L (ref 22–32)
CO2: 25 mmol/L (ref 22–32)
Calcium: 8.6 mg/dL — ABNORMAL LOW (ref 8.9–10.3)
Calcium: 8.9 mg/dL (ref 8.9–10.3)
Chloride: 102 mmol/L (ref 98–111)
Chloride: 104 mmol/L (ref 98–111)
Creatinine, Ser: 1.08 mg/dL (ref 0.61–1.24)
Creatinine, Ser: 1.15 mg/dL (ref 0.61–1.24)
GFR, Estimated: >60 mL/min (ref >60)
GFR, Estimated: >60 mL/min (ref >60)
Glucose, Bld: 108 mg/dL — ABNORMAL HIGH (ref 70–99)
Glucose, Bld: 125 mg/dL — ABNORMAL HIGH (ref 70–99)
Potassium: 3.5 mmol/L (ref 3.5–5.1)
Potassium: 4 mmol/L (ref 3.5–5.1)
Sodium: 136 mmol/L (ref 135–145)
Sodium: 138 mmol/L (ref 135–145)
Total Bilirubin: 0.4 mg/dL (ref 0.3–1.2)
Total Bilirubin: 0.6 mg/dL (ref 0.3–1.2)
Total Protein: 6.7 g/dL (ref 6.5–8.1)
Total Protein: 7.6 g/dL (ref 6.5–8.1)

## 2021-11-24 LAB — PROCALCITONIN: Procalcitonin: 0.22 ng/mL

## 2021-11-24 LAB — TROPONIN I (HIGH SENSITIVITY)
Troponin I (High Sensitivity): 21 ng/L — ABNORMAL HIGH (ref <18)
Troponin I (High Sensitivity): 28 ng/L — ABNORMAL HIGH (ref <18)

## 2021-11-24 LAB — FERRITIN
Ferritin: 556 ng/mL — ABNORMAL HIGH (ref 24–336)
Ferritin: 533 ng/mL — ABNORMAL HIGH (ref 24–336)

## 2021-11-24 LAB — C-REACTIVE PROTEIN
CRP: 5 mg/dL — ABNORMAL HIGH (ref <1.0)
CRP: 9.5 mg/dL — ABNORMAL HIGH (ref <1.0)

## 2021-11-24 LAB — LACTATE DEHYDROGENASE: LDH: 212 U/L — ABNORMAL HIGH (ref 98–192)

## 2021-11-24 LAB — D-DIMER, QUANTITATIVE
D-Dimer, Quant: 2.56 ug/mL-FEU — ABNORMAL HIGH (ref 0.00–0.50)
D-Dimer, Quant: 3.02 ug/mL-FEU — ABNORMAL HIGH (ref 0.00–0.50)

## 2021-11-24 LAB — FIBRINOGEN: Fibrinogen: 345 mg/dL (ref 210–475)

## 2021-11-24 LAB — TRIGLYCERIDES: Triglycerides: 138 mg/dL (ref <150)

## 2021-11-24 LAB — LACTIC ACID, PLASMA
Lactic Acid, Venous: 0.8 mmol/L (ref 0.5–1.9)
Lactic Acid, Venous: 1 mmol/L (ref 0.5–1.9)

## 2021-11-24 LAB — HIV ANTIBODY (ROUTINE TESTING W REFLEX): HIV Screen 4th Generation wRfx: NONREACTIVE

## 2021-11-24 MED ORDER — METHYLPREDNISOLONE SODIUM SUCC 40 MG IJ SOLR
0.5000 mg/kg | Freq: Two times a day (BID) | INTRAMUSCULAR | Status: DC
  Administered 2021-11-24 – 2021-11-26 (×5): 40 mg via INTRAVENOUS
  Filled 2021-11-24 (×5): qty 1

## 2021-11-24 MED ORDER — PREDNISONE 50 MG PO TABS: 50.0000 mg | ORAL_TABLET | Freq: Every day | ORAL | Status: DC

## 2021-11-24 MED ORDER — SODIUM CHLORIDE 0.9 % IV SOLN
100.0000 mg | INTRAVENOUS | Status: AC
  Administered 2021-11-24 (×2): 100 mg via INTRAVENOUS

## 2021-11-24 MED ORDER — IOHEXOL 350 MG/ML SOLN
80.0000 mL | Freq: Once | INTRAVENOUS | Status: AC | PRN
  Administered 2021-11-24: 80 mL via INTRAVENOUS

## 2021-11-24 MED ORDER — GUAIFENESIN-DM 100-10 MG/5ML PO SYRP: 10.0000 mL | ORAL_SOLUTION | ORAL | Status: DC | PRN

## 2021-11-24 MED ORDER — ACETAMINOPHEN 325 MG PO TABS: 650.0000 mg | ORAL_TABLET | Freq: Four times a day (QID) | ORAL | Status: DC | PRN

## 2021-11-24 MED ORDER — SODIUM CHLORIDE (PF) 0.9 % IJ SOLN
INTRAMUSCULAR | Status: AC
  Filled 2021-11-24: qty 50

## 2021-11-24 MED ORDER — MENTHOL 3 MG MT LOZG
1.0000 | LOZENGE | OROMUCOSAL | Status: DC | PRN
  Administered 2021-11-25: 3 mg via ORAL
  Filled 2021-11-24: qty 9

## 2021-11-24 MED ORDER — ZINC SULFATE 220 (50 ZN) MG PO CAPS
220.0000 mg | ORAL_CAPSULE | Freq: Every day | ORAL | Status: DC
  Administered 2021-11-24 – 2021-11-26 (×3): 220 mg via ORAL
  Filled 2021-11-24 (×3): qty 1

## 2021-11-24 MED ORDER — SODIUM CHLORIDE 0.9 % IV SOLN
100.0000 mg | Freq: Every day | INTRAVENOUS | Status: DC
  Administered 2021-11-25 – 2021-11-26 (×2): 100 mg via INTRAVENOUS
  Filled 2021-11-24 (×2): qty 20

## 2021-11-24 MED ORDER — PHENOL 1.4 % MT LIQD
1.0000 | OROMUCOSAL | Status: DC | PRN
  Administered 2021-11-25: 1 via OROMUCOSAL
  Filled 2021-11-24: qty 177

## 2021-11-24 MED ORDER — ENOXAPARIN SODIUM 40 MG/0.4ML IJ SOSY
40.0000 mg | PREFILLED_SYRINGE | INTRAMUSCULAR | Status: DC
  Administered 2021-11-24 – 2021-11-25 (×2): 40 mg via SUBCUTANEOUS
  Filled 2021-11-24 (×2): qty 0.4

## 2021-11-24 MED ORDER — HYDROCOD POLI-CHLORPHE POLI ER 10-8 MG/5ML PO SUER: 5.0000 mL | Freq: Two times a day (BID) | ORAL | Status: DC | PRN

## 2021-11-24 MED ORDER — IPRATROPIUM-ALBUTEROL 20-100 MCG/ACT IN AERS
1.0000 | INHALATION_SPRAY | Freq: Four times a day (QID) | RESPIRATORY_TRACT | Status: DC
  Administered 2021-11-24 – 2021-11-26 (×8): 1 via RESPIRATORY_TRACT
  Filled 2021-11-24 (×2): qty 4

## 2021-11-24 MED ORDER — ASCORBIC ACID 500 MG PO TABS
500.0000 mg | ORAL_TABLET | Freq: Every day | ORAL | Status: DC
  Administered 2021-11-24 – 2021-11-26 (×3): 500 mg via ORAL
  Filled 2021-11-24 (×3): qty 1

## 2021-11-24 NOTE — Progress Notes (Addendum)
The patient arrived to unit. Patient is alert, oriented (x4) VS assessed, telemetry applied. Skin dry intact. Pt was not agreeable to changing boxer briefs at this time. Will continue to encourage. Personal belongings were placed within reach. Call button in hand. Bed alarm set.

## 2021-11-24 NOTE — Plan of Care (Signed)
Discussed pain management options at this time. The pt states that pain is 2/10 generalized. He does not request an intervention at this time. Will continue to monitor and f/u.

## 2021-11-24 NOTE — ED Notes (Signed)
Pt sleeping at this time VSS

## 2021-11-24 NOTE — ED Notes (Signed)
Pt sleeping comfortably at this time. VSS

## 2021-11-24 NOTE — H&P (Signed)
History and Physical    Patient: Antonio Fernandez [id number] DOB: 03/09/1956 DOA: 11/23/2021 DOS: the patient was seen and examined on 11/24/2021 PCP: Ladell Pier, MD  Patient coming from: Home  Chief Complaint:  Chief Complaint  Patient presents with   Sore Throat   Cough    HPI: [name] is a 66 y.o. male with medical history significant of HTN, HLD, TBI. Presenting with fevers, sore throat, and shortness of breath. His symptoms started 3 days ago. He first noticed a sore throat. Tried some OTC meds, but didn't help. Yesterday he started having cough and shortness of breath. He became concerned and came to the ED for assistance. He reports that he has had contact w/ someone with COVID recently. He otherwise denies any alleviating or aggravating factors.   Review of Systems: As mentioned in the history of present illness. All other systems reviewed and are negative. Past Medical History:  Diagnosis Date   PE (pulmonary embolism) 2007   Was inpatient for surgery at time.   TBI (traumatic brain injury)    Past Surgical History:  Procedure Laterality Date   BRAIN SURGERY     IVC filter     Social History:  reports that he has been smoking cigarettes. He has been smoking an average of .15 packs per day. He has never used smokeless tobacco. He reports that he does not currently use alcohol. He reports that he does not use drugs.  No Known Allergies  Family History  Problem Relation Age of Onset   Clotting disorder Neg Hx     Prior to Admission medications   Medication Sig Start Date End Date Taking? Authorizing Provider  amLODipine (NORVASC) 10 MG tablet Take 1 tablet (10 mg total) by mouth daily. 02/01/20   Ladell Pier, MD  aspirin EC 81 MG tablet Take 1 tablet (81 mg total) by mouth daily. 02/01/20   Ladell Pier, MD  atorvastatin (LIPITOR) 20 MG tablet Take 1 tablet (20 mg total) by mouth at bedtime. 02/01/20   Ladell Pier, MD  nicotine  (NICODERM CQ - DOSED IN MG/24 HR) 7 mg/24hr patch Place 1 patch (7 mg total) onto the skin daily. 02/01/20   Ladell Pier, MD    Physical Exam: Vitals:   11/24/21 0800 11/24/21 0845 11/24/21 1123 11/24/21 1134  BP: 121/83 115/77  138/87  Pulse: 65 63  65  Resp: 10   18  Temp:    97.9 F (36.6 C)  TempSrc:    Oral  SpO2: 97% 96%  100%  Weight:   79.8 kg   Height:   5\' 7"  (1.702 m)    General: 66 y.o. male resting in bed in NAD Eyes: PERRL, normal sclera ENMT: Nares patent w/o discharge, orophaynx clear, dentition normal, ears w/o discharge/lesions/ulcers Neck: Supple, trachea midline Cardiovascular: RRR, +S1, S2, no m/g/r, equal pulses throughout Respiratory: b/l rhonchi, w/r, increased WOB on 2.5L [place] GI: BS+, NDNT, no masses noted, no organomegaly noted MSK: No e/c/c Neuro: A&O x 3, no focal deficits Psyc: Appropriate interaction and affect, calm/cooperative   Data Reviewed:  Trp 21 -> 28 CRP 5.0 D-dimer 3.02 COVID positive  EKG: sinus no st elevations CTA chest: pending.  Assessment and Plan: No notes have been filed under this hospital service. Service: Hospitalist COVID 19 infection Acute hypoxic respiratory failure     - admit to inpatient, tele     - continue remdes, add steroids, inhalers, guaifenesin     -  wean O2 as able     - check CTA chest; follow inflammatory markers  HTN     - resume home regimen when confirmed  HLD     - resume home regimen when confirmed  Elevated troponins     - flat 21 -> 28     - EKG: sinus, no st elevations     - no complaints of chest pain, monitor  Hx of TBI     - continue outpt follow up  Advance Care Planning:   Code Status: FULL  Consults: None  Family Communication: Attempted call to contact listed in chart [phone]); the individual who answered said that while the number was correctly dialed, they where not aware of the individual listed as the contact.  Severity of Illness: The appropriate  patient status for this patient is INPATIENT. Inpatient status is judged to be reasonable and necessary in order to provide the required intensity of service to ensure the patient's safety. The patient's presenting symptoms, physical exam findings, and initial radiographic and laboratory data in the context of their chronic comorbidities is felt to place them at high risk for further clinical deterioration. Furthermore, it is not anticipated that the patient will be medically stable for discharge from the hospital within 2 midnights of admission.   * I certify that at the point of admission it is my clinical judgment that the patient will require inpatient hospital care spanning beyond 2 midnights from the point of admission due to high intensity of service, high risk for further deterioration and high frequency of surveillance required.*  Author: Jonnie Finner, DO 11/24/2021 12:19 PM  For on call review [email or web address].

## 2021-11-24 NOTE — ED Notes (Signed)
At some pt pt pulled out IV and was laying on top of same in bed. Cathter intact new IV started

## 2021-11-24 NOTE — ED Notes (Signed)
Pt stated that he was doing fine vitals WDL

## 2021-11-24 NOTE — ED Notes (Signed)
Pt alert to month, location [facility] and hospital) but can not tell me what name he likes to be called by. Pt needed assistance with urinal. Pt is calm and does follow commands and has a good dispostion

## 2021-11-24 NOTE — Plan of Care (Signed)
COVID, new O2 requirement, satting 89% on RA, just 2L for now.  Got decadron in ED.  Ordering remdesivir.  Tele, obs.  TR[facility] will assume care on arrival to accepting facility. Until arrival, care as per EDP. However, TRH available 24/7 for questions and assistance.  Nursing staff, please page [facility] Admits and Consults [phone]) as soon as the patient arrives to the hospital.

## 2021-11-25 DIAGNOSIS — I10 Essential (primary) hypertension: Secondary | ICD-10-CM

## 2021-11-25 LAB — CBC WITH DIFFERENTIAL/PLATELET
Abs Immature Granulocytes: 0.05 10*3/uL (ref 0.00–0.07)
Basophils Absolute: 0 10*3/uL (ref 0.0–0.1)
Basophils Relative: 0 %
Eosinophils Absolute: 0 10*3/uL (ref 0.0–0.5)
Eosinophils Relative: 0 %
HCT: 43.1 % (ref 39.0–52.0)
Hemoglobin: 13.9 g/dL (ref 13.0–17.0)
Immature Granulocytes: 1 %
Lymphocytes Relative: 11 %
Lymphs Abs: 1.2 10*3/uL (ref 0.7–4.0)
MCH: 28.6 pg (ref 26.0–34.0)
MCHC: 32.3 g/dL (ref 30.0–36.0)
MCV: 88.7 fL (ref 80.0–100.0)
Monocytes Absolute: 0.5 10*3/uL (ref 0.1–1.0)
Monocytes Relative: 5 %
Neutro Abs: 8.4 10*3/uL — ABNORMAL HIGH (ref 1.7–7.7)
Neutrophils Relative %: 83 %
Platelets: 135 10*3/uL — ABNORMAL LOW (ref 150–400)
RBC: 4.86 MIL/uL (ref 4.22–5.81)
RDW: 14.9 % (ref 11.5–15.5)
WBC: 10.2 10*3/uL (ref 4.0–10.5)
nRBC: 0 % (ref 0.0–0.2)

## 2021-11-25 LAB — COMPREHENSIVE METABOLIC PANEL
ALT: 25 U/L (ref 0–44)
AST: 67 U/L — ABNORMAL HIGH (ref 15–41)
Albumin: 3.3 g/dL — ABNORMAL LOW (ref 3.5–5.0)
Alkaline Phosphatase: 41 U/L (ref 38–126)
Anion gap: 9 (ref 5–15)
BUN: 18 mg/dL (ref 8–23)
CO2: 24 mmol/L (ref 22–32)
Calcium: 8.8 mg/dL — ABNORMAL LOW (ref 8.9–10.3)
Chloride: 103 mmol/L (ref 98–111)
Creatinine, Ser: 0.98 mg/dL (ref 0.61–1.24)
GFR, Estimated: >60 mL/min (ref >60)
Glucose, Bld: 119 mg/dL — ABNORMAL HIGH (ref 70–99)
Potassium: 3.8 mmol/L (ref 3.5–5.1)
Sodium: 136 mmol/L (ref 135–145)
Total Bilirubin: 0.5 mg/dL (ref 0.3–1.2)
Total Protein: 7.5 g/dL (ref 6.5–8.1)

## 2021-11-25 LAB — C-REACTIVE PROTEIN: CRP: 8.4 mg/dL — ABNORMAL HIGH (ref <1.0)

## 2021-11-25 LAB — FERRITIN: Ferritin: 612 ng/mL — ABNORMAL HIGH (ref 24–336)

## 2021-11-25 LAB — D-DIMER, QUANTITATIVE: D-Dimer, Quant: 1.82 ug/mL-FEU — ABNORMAL HIGH (ref 0.00–0.50)

## 2021-11-25 MED ORDER — ASPIRIN EC 81 MG PO TBEC
81.0000 mg | DELAYED_RELEASE_TABLET | Freq: Every day | ORAL | Status: DC
  Administered 2021-11-25 – 2021-11-26 (×2): 81 mg via ORAL
  Filled 2021-11-25 (×2): qty 1

## 2021-11-25 MED ORDER — SODIUM CHLORIDE 0.9 % IV SOLN: INTRAVENOUS | Status: DC | PRN

## 2021-11-25 MED ORDER — AMLODIPINE BESYLATE 10 MG PO TABS
10.0000 mg | ORAL_TABLET | Freq: Every day | ORAL | Status: DC
  Administered 2021-11-25 – 2021-11-26 (×2): 10 mg via ORAL
  Filled 2021-11-25 (×2): qty 1

## 2021-11-25 NOTE — Assessment & Plan Note (Signed)
-   No chest pain and flat curve - No further work-up indicated

## 2021-11-25 NOTE — Progress Notes (Signed)
End of shift  Pt weaned off of oxygen.  Pt given first dose of remdesivir today.  Complaints of sore throat pain, gave Cepacol lozenge and chloraseptic  throat spray which helped with discomfort.   Pt is a 1 assist with a walker to the bathroom.

## 2021-11-25 NOTE — Assessment & Plan Note (Addendum)
-   Continue amlodipine.  Patient had also stated no longer taking but blood pressures are above goal therefore we will resume

## 2021-11-25 NOTE — Progress Notes (Signed)
Progress Note   Patient: Antonio Fernandez CommentGarry C Fernandez RUE:[id number] DOB: 08/22/1956 DOA: 11/23/2021     1 DOS: the patient was seen and examined on 11/25/2021   Brief hospital course: Mr. Antonio Fernandez is a 66 year old male with PMH TBI, HTN, HLD who presented with fevers, sore throat, shortness of breath.  After developing worsening cough and shortness of breath he presented to the ER for further evaluation.  He had also reported contact with a COVID positive person recently.  On work-up he was found to be positive for COVID-19.  He was started on remdesivir.   Assessment and Plan: * Acute hypoxemic respiratory failure due to COVID-19 [facility](HCC)- (present on admission) - Desaturation down to 87% on room air during work-up in the ER and was placed on oxygen - Continue weaning as able.  Not on oxygen at home  COVID-19- (present on admission) CXR: Low lung volumes but no large amounts of infiltrates or opacities appreciated CT chest: Negative for PE.  Some breathing artifact First positive test date 2/17 Oxygen requirement: 2L CRP: 5>>9.5 Remdesivir: 2/18 >> current Steroids: Solumedrol DVT Prophylaxis: enoxaparin (LOVENOX) injection 40 mg Start: 11/24/21 2200 - Incentive spirometer and flutter use recommended. Self prone at night as able  Elevated troponin - No chest pain and flat curve - No further work-up indicated  HLD (hyperlipidemia) - Patient stated no longer taking.  Hold off on resuming for now  Essential hypertension- (present on admission) - Continue amlodipine.  Patient had also stated no longer taking but blood pressures are above goal therefore we will resume    Subjective: No events overnight.  Seen in his room this morning.  He was resting comfortably.  Already has been weaned down to room air.  Physical Exam: Vitals:   11/24/21 2005 11/24/21 2345 11/25/21 0440 11/25/21 0927  BP: (!) 151/91 (!) 142/92 132/80 (!) 146/83  Pulse: 71 82 75   Resp: 18 18 18    Temp: 98.5 F (36.9 C)  98.7 F (37.1 C) 98 F (36.7 C)   TempSrc: Oral Oral Oral   SpO2: 100% 98% 98% 98%  Weight:      Height:      Physical Exam Constitutional:      General: He is not in acute distress.    Appearance: Normal appearance.  HENT:     Head: Normocephalic and atraumatic.     Mouth/Throat:     Mouth: Mucous membranes are moist.  Eyes:     Extraocular Movements: Extraocular movements intact.  Cardiovascular:     Rate and Rhythm: Normal rate and regular rhythm.     Heart sounds: Normal heart sounds.  Pulmonary:     Effort: Pulmonary effort is normal. No respiratory distress.     Breath sounds: Normal breath sounds. No wheezing.  Abdominal:     General: Bowel sounds are normal. There is no distension.     Palpations: Abdomen is soft.     Tenderness: There is no abdominal tenderness.  Musculoskeletal:        General: Normal range of motion.     Cervical back: Normal range of motion and neck supple.  Skin:    General: Skin is warm and dry.  Neurological:     General: No focal deficit present.     Mental Status: He is alert.  Psychiatric:        Mood and Affect: Mood normal.        Behavior: Behavior normal.     Data Reviewed:  Results for  orders placed or performed during the hospital encounter of 11/23/21 (from the past 24 hour(s))  HIV Antibody (routine testing w rflx)     Status: None   Collection Time: 11/24/21 12:50 PM  Result Value Ref Range   HIV Screen 4th Generation wRfx Non Reactive Non Reactive  C-reactive protein     Status: Abnormal   Collection Time: 11/24/21 12:50 PM  Result Value Ref Range   CRP 9.5 (H) <1.0 mg/dL  Comprehensive metabolic panel     Status: Abnormal   Collection Time: 11/24/21 12:50 PM  Result Value Ref Range   Sodium 136 135 - 145 mmol/L   Potassium 4.0 3.5 - 5.1 mmol/L   Chloride 102 98 - 111 mmol/L   CO2 25 22 - 32 mmol/L   Glucose, Bld 125 (H) 70 - 99 mg/dL   BUN 15 8 - 23 mg/dL   Creatinine, Ser 1.15 0.61 - 1.24 mg/dL   Calcium 8.6  (L) 8.9 - 10.3 mg/dL   Total Protein 7.6 6.5 - 8.1 g/dL   Albumin 3.5 3.5 - 5.0 g/dL   AST 60 (H) 15 - 41 U/L   ALT 26 0 - 44 U/L   Alkaline Phosphatase 44 38 - 126 U/L   Total Bilirubin 0.6 0.3 - 1.2 mg/dL   GFR, Estimated >60 >60 mL/min   Anion gap 9 5 - 15  CBC with Differential/Platelet     Status: Abnormal   Collection Time: 11/24/21 12:50 PM  Result Value Ref Range   WBC 8.1 4.0 - 10.5 K/uL   RBC 4.96 4.22 - 5.81 MIL/uL   Hemoglobin 14.0 13.0 - 17.0 g/dL   HCT 44.8 39.0 - 52.0 %   MCV 90.3 80.0 - 100.0 fL   MCH 28.2 26.0 - 34.0 pg   MCHC 31.3 30.0 - 36.0 g/dL   RDW 15.2 11.5 - 15.5 %   Platelets 126 (L) 150 - 400 K/uL   nRBC 0.0 0.0 - 0.2 %   Neutrophils Relative % 82 %   Neutro Abs 6.5 1.7 - 7.7 K/uL   Lymphocytes Relative 12 %   Lymphs Abs 1.0 0.7 - 4.0 K/uL   Monocytes Relative 6 %   Monocytes Absolute 0.5 0.1 - 1.0 K/uL   Eosinophils Relative 0 %   Eosinophils Absolute 0.0 0.0 - 0.5 K/uL   Basophils Relative 0 %   Basophils Absolute 0.0 0.0 - 0.1 K/uL   Immature Granulocytes 0 %   Abs Immature Granulocytes 0.03 0.00 - 0.07 K/uL  D-dimer, quantitative     Status: Abnormal   Collection Time: 11/24/21 12:50 PM  Result Value Ref Range   D-Dimer, Quant 2.56 (H) 0.00 - 0.50 ug/mL-FEU  Ferritin     Status: Abnormal   Collection Time: 11/24/21 12:50 PM  Result Value Ref Range   Ferritin 533 (H) 24 - 336 ng/mL  CBC with Differential/Platelet     Status: Abnormal   Collection Time: 11/25/21  3:47 AM  Result Value Ref Range   WBC 10.2 4.0 - 10.5 K/uL   RBC 4.86 4.22 - 5.81 MIL/uL   Hemoglobin 13.9 13.0 - 17.0 g/dL   HCT 43.1 39.0 - 52.0 %   MCV 88.7 80.0 - 100.0 fL   MCH 28.6 26.0 - 34.0 pg   MCHC 32.3 30.0 - 36.0 g/dL   RDW 14.9 11.5 - 15.5 %   Platelets 135 (L) 150 - 400 K/uL   nRBC 0.0 0.0 - 0.2 %   Neutrophils Relative %  83 %   Neutro Abs 8.4 (H) 1.7 - 7.7 K/uL   Lymphocytes Relative 11 %   Lymphs Abs 1.2 0.7 - 4.0 K/uL   Monocytes Relative 5 %    Monocytes Absolute 0.5 0.1 - 1.0 K/uL   Eosinophils Relative 0 %   Eosinophils Absolute 0.0 0.0 - 0.5 K/uL   Basophils Relative 0 %   Basophils Absolute 0.0 0.0 - 0.1 K/uL   Immature Granulocytes 1 %   Abs Immature Granulocytes 0.05 0.00 - 0.07 K/uL  Comprehensive metabolic panel     Status: Abnormal   Collection Time: 11/25/21  3:47 AM  Result Value Ref Range   Sodium 136 135 - 145 mmol/L   Potassium 3.8 3.5 - 5.1 mmol/L   Chloride 103 98 - 111 mmol/L   CO2 24 22 - 32 mmol/L   Glucose, Bld 119 (H) 70 - 99 mg/dL   BUN 18 8 - 23 mg/dL   Creatinine, Ser 0.98 0.61 - 1.24 mg/dL   Calcium 8.8 (L) 8.9 - 10.3 mg/dL   Total Protein 7.5 6.5 - 8.1 g/dL   Albumin 3.3 (L) 3.5 - 5.0 g/dL   AST 67 (H) 15 - 41 U/L   ALT 25 0 - 44 U/L   Alkaline Phosphatase 41 38 - 126 U/L   Total Bilirubin 0.5 0.3 - 1.2 mg/dL   GFR, Estimated >60 >60 mL/min   Anion gap 9 5 - 15  D-dimer, quantitative     Status: Abnormal   Collection Time: 11/25/21  3:47 AM  Result Value Ref Range   D-Dimer, Quant 1.82 (H) 0.00 - 0.50 ug/mL-FEU  Ferritin     Status: Abnormal   Collection Time: 11/25/21  3:47 AM  Result Value Ref Range   Ferritin 612 (H) 24 - 336 ng/mL    I have Reviewed nursing notes, Vitals, and Lab results since pt's last encounter. Pertinent lab results : see above I have ordered test including BMP, CBC, Mg I have reviewed the last note from staff over past 24 hours I have discussed pt's care plan and test results with nursing staff, case manager  Antimicrobials: Remdesivir 2/18 >> current  Consultants:   Procedures:    DVT ppx:  enoxaparin (LOVENOX) injection 40 mg Start: 11/24/21 2200     Code Status: Full Code    Family Communication:   Disposition: Status is: Inpatient Remains inpatient appropriate because: Treatment as outlined in A&P    Planned Discharge Destination: Home   Author: Dwyane Dee, MD 11/25/2021 11:28 AM  For on call review [email or web address].

## 2021-11-25 NOTE — Assessment & Plan Note (Addendum)
CXR: Low lung volumes but no large amounts of infiltrates or opacities appreciated CT chest: Negative for PE.  Some breathing artifact First positive test date 2/17 Oxygen requirement: 2L CRP: 5>>9.5>>8.4>>3.7 Remdesivir: 2/18 >> 2/20 Steroids: Solumedrol; course considered complete in the hospital DVT Prophylaxis: enoxaparin (LOVENOX) injection 40 mg Start: 11/24/21 2200 - Incentive spirometer and flutter use recommended

## 2021-11-25 NOTE — Assessment & Plan Note (Addendum)
-   Patient stated no longer taking.  Hold off on resuming for now

## 2021-11-25 NOTE — Hospital Course (Addendum)
Antonio Fernandez is a 66 year old male with PMH TBI, HTN, HLD who presented with fevers, sore throat, shortness of breath.  After developing worsening cough and shortness of breath he presented to the ER for further evaluation.  He had also reported contact with a COVID positive person recently.  On work-up he was found to be positive for COVID-19.  He was started on remdesivir. He had good clinical improvement and was able to be weaned down to RA easily prior to discharge. He completed 3 days remdesivir in the hospital and was considered stable for discharge home.

## 2021-11-25 NOTE — Assessment & Plan Note (Addendum)
-   Desaturation down to 87% on room air during work-up in the ER and was placed on oxygen - Continue weaning as able.  Not on oxygen at home -Weaned to room air easily on 11/25/2021

## 2021-11-25 NOTE — Plan of Care (Signed)

## 2021-11-26 DIAGNOSIS — A419 Sepsis, unspecified organism: Secondary | ICD-10-CM | POA: Insufficient documentation

## 2021-11-26 DIAGNOSIS — R6520 Severe sepsis without septic shock: Secondary | ICD-10-CM

## 2021-11-26 LAB — CBC WITH DIFFERENTIAL/PLATELET
Abs Immature Granulocytes: 0.06 10*3/uL (ref 0.00–0.07)
Basophils Absolute: 0 10*3/uL (ref 0.0–0.1)
Basophils Relative: 0 %
Eosinophils Absolute: 0 10*3/uL (ref 0.0–0.5)
Eosinophils Relative: 0 %
HCT: 42.7 % (ref 39.0–52.0)
Hemoglobin: 13.6 g/dL (ref 13.0–17.0)
Immature Granulocytes: 1 %
Lymphocytes Relative: 9 %
Lymphs Abs: 0.9 10*3/uL (ref 0.7–4.0)
MCH: 28.3 pg (ref 26.0–34.0)
MCHC: 31.9 g/dL (ref 30.0–36.0)
MCV: 89 fL (ref 80.0–100.0)
Monocytes Absolute: 0.5 10*3/uL (ref 0.1–1.0)
Monocytes Relative: 4 %
Neutro Abs: 9.3 10*3/uL — ABNORMAL HIGH (ref 1.7–7.7)
Neutrophils Relative %: 86 %
Platelets: 145 10*3/uL — ABNORMAL LOW (ref 150–400)
RBC: 4.8 MIL/uL (ref 4.22–5.81)
RDW: 14.6 % (ref 11.5–15.5)
WBC: 10.7 10*3/uL — ABNORMAL HIGH (ref 4.0–10.5)
nRBC: 0 % (ref 0.0–0.2)

## 2021-11-26 LAB — COMPREHENSIVE METABOLIC PANEL
ALT: 29 U/L (ref 0–44)
AST: 55 U/L — ABNORMAL HIGH (ref 15–41)
Albumin: 3 g/dL — ABNORMAL LOW (ref 3.5–5.0)
Alkaline Phosphatase: 38 U/L (ref 38–126)
Anion gap: 7 (ref 5–15)
BUN: 20 mg/dL (ref 8–23)
CO2: 25 mmol/L (ref 22–32)
Calcium: 8.5 mg/dL — ABNORMAL LOW (ref 8.9–10.3)
Chloride: 104 mmol/L (ref 98–111)
Creatinine, Ser: 0.91 mg/dL (ref 0.61–1.24)
GFR, Estimated: >60 mL/min (ref >60)
Glucose, Bld: 163 mg/dL — ABNORMAL HIGH (ref 70–99)
Potassium: 3.7 mmol/L (ref 3.5–5.1)
Sodium: 136 mmol/L (ref 135–145)
Total Bilirubin: 0.4 mg/dL (ref 0.3–1.2)
Total Protein: 6.8 g/dL (ref 6.5–8.1)

## 2021-11-26 LAB — D-DIMER, QUANTITATIVE: D-Dimer, Quant: 0.98 ug/mL-FEU — ABNORMAL HIGH (ref 0.00–0.50)

## 2021-11-26 LAB — LACTATE DEHYDROGENASE: LDH: 164 U/L (ref 98–192)

## 2021-11-26 LAB — FERRITIN: Ferritin: 630 ng/mL — ABNORMAL HIGH (ref 24–336)

## 2021-11-26 LAB — MAGNESIUM: Magnesium: 2.2 mg/dL (ref 1.7–2.4)

## 2021-11-26 LAB — C-REACTIVE PROTEIN: CRP: 3.7 mg/dL — ABNORMAL HIGH (ref <1.0)

## 2021-11-26 MED ORDER — PNEUMOCOCCAL VAC POLYVALENT 25 MCG/0.5ML IJ INJ
0.5000 mL | INJECTION | INTRAMUSCULAR | Status: DC
  Filled 2021-11-26: qty 0.5

## 2021-11-26 MED ORDER — PNEUMOCOCCAL VAC POLYVALENT 25 MCG/0.5ML IJ INJ
0.5000 mL | INJECTION | INTRAMUSCULAR | Status: AC
  Administered 2021-11-26: 0.5 mL via INTRAMUSCULAR
  Filled 2021-11-26: qty 0.5

## 2021-11-26 MED ORDER — AMLODIPINE BESYLATE 10 MG PO TABS: 10.0000 mg | ORAL_TABLET | Freq: Every day | ORAL | 3 refills | Status: AC

## 2021-11-26 NOTE — TOC Progression Note (Signed)
Transition of Care [facility]) - Progression Note    Patient Details  Name: JOURDAIN GUAY MRN: [id number] Date of Birth: [birth year]  Transition of Care [facility]) CM/SW Contact  Geni Bers, RN Phone Number: 11/26/2021, 1:51 PM  Clinical Narrative:      Transition of Care (TOC) Screening Note   Patient Details  Name: RAHMEL NEDVED Date of Birth: 1956-01-09   Transition of Care [facility]) CM/SW Contact:    Geni Bers, RN Phone Number: 11/26/2021, 1:51 PM    Transition of Care Department [facility]) has reviewed patient and no TOC needs have been identified at this time. We will continue to monitor patient advancement through interdisciplinary progression rounds. If new patient transition needs arise, please place a TOC consult.         Expected Discharge Plan and Services           Expected Discharge Date: 11/26/21                                     Social Determinants of Health (SDOH) Interventions    Readmission Risk Interventions No flowsheet data found.

## 2021-11-26 NOTE — Discharge Summary (Signed)
Physician Discharge Summary   Patient: Antonio Fernandez MRN: [id number] DOB: January 09, 1956  Admit date:     11/23/2021  Discharge date: 11/26/21  Discharge Physician: Dwyane Dee   PCP: Ladell Pier, MD   Recommendations at discharge:   Continue routine outpatient medical care.  Discharge Diagnoses: Principal Problem:   Acute hypoxemic respiratory failure due to COVID-19 [facility]) Active Problems:   COVID-19   Essential hypertension   HLD (hyperlipidemia)   Elevated troponin  Resolved Problems:   Severe sepsis [facility])   Hospital Course: Mr. Antonio Fernandez is a 66 year old male with PMH TBI, HTN, HLD who presented with fevers, sore throat, shortness of breath.  After developing worsening cough and shortness of breath he presented to the ER for further evaluation.  He had also reported contact with a COVID positive person recently.  On work-up he was found to be positive for COVID-19.  He was started on remdesivir. He had good clinical improvement and was able to be weaned down to RA easily prior to discharge. He completed 3 days remdesivir in the hospital and was considered stable for discharge home.   Assessment and Plan: * Acute hypoxemic respiratory failure due to COVID-19 [facility])- (present on admission) - Desaturation down to 87% on room air during work-up in the ER and was placed on oxygen - Continue weaning as able.  Not on oxygen at home -Weaned to room air easily on 11/25/2021  COVID-19- (present on admission) CXR: Low lung volumes but no large amounts of infiltrates or opacities appreciated CT chest: Negative for PE.  Some breathing artifact First positive test date 2/17 Oxygen requirement: 2L CRP: 5>>9.5>>8.4>>3.7 Remdesivir: 2/18 >> 2/20 Steroids: Solumedrol; course considered complete in the hospital DVT Prophylaxis: enoxaparin (LOVENOX) injection 40 mg Start: 11/24/21 2200 - Incentive spirometer and flutter use recommended  Elevated troponin - No chest pain and flat  curve - No further work-up indicated  HLD (hyperlipidemia) - Patient stated no longer taking.  Hold off on resuming for now  Essential hypertension- (present on admission) - Continue amlodipine.  Patient had also stated no longer taking but blood pressures are above goal therefore we will resume  Severe sepsis (HCC)-resolved as of 11/26/2021 - Febrile, tachycardia, tachypnea.  Source considered pulmonary from viral infection; new oxygen demand on admission -see COVID-19 treatment    Consultants:  Procedures performed:   Disposition: Home Diet recommendation:  Discharge Diet Orders (From admission, onward)     Start     Ordered   11/26/21 0000  Diet - low sodium heart healthy        11/26/21 0954           Cardiac diet  DISCHARGE MEDICATION: Allergies as of 11/26/2021   No Known Allergies      Medication List     STOP taking these medications    atorvastatin 20 MG tablet Commonly known as: LIPITOR   nicotine 7 mg/24hr patch Commonly known as: NICODERM CQ - dosed in mg/24 hr       TAKE these medications    amLODipine 10 MG tablet Commonly known as: NORVASC Take 1 tablet (10 mg total) by mouth daily.   aspirin EC 81 MG tablet Take 1 tablet (81 mg total) by mouth daily.         Discharge Exam:  Vitals with BMI 11/26/2021 11/25/2021 11/25/2021  Height - - -  Weight - - -  BMI - - -  Systolic [id number]  Diastolic 76 88 90  Pulse 88 80 87   Physical Exam Constitutional:      General: He is not in acute distress.    Appearance: Normal appearance.  HENT:     Head: Normocephalic and atraumatic.     Mouth/Throat:     Mouth: Mucous membranes are moist.  Eyes:     Extraocular Movements: Extraocular movements intact.  Cardiovascular:     Rate and Rhythm: Normal rate and regular rhythm.     Heart sounds: Normal heart sounds.  Pulmonary:     Effort: Pulmonary effort is normal. No respiratory distress.     Breath sounds: Normal breath sounds. No  wheezing.  Abdominal:     General: Bowel sounds are normal. There is no distension.     Palpations: Abdomen is soft.     Tenderness: There is no abdominal tenderness.  Musculoskeletal:        General: Normal range of motion.     Cervical back: Normal range of motion and neck supple.  Skin:    General: Skin is warm and dry.  Neurological:     General: No focal deficit present.     Mental Status: He is alert.  Psychiatric:        Mood and Affect: Mood normal.        Behavior: Behavior normal.     Condition at discharge: stable  The results of significant diagnostics from this hospitalization (including imaging, microbiology, ancillary and laboratory) are listed below for reference.   Imaging Studies: DG Chest 1 View  Result Date: 11/23/2021 CLINICAL DATA:  Sore throat and cough. EXAM: CHEST  1 VIEW COMPARISON:  None. FINDINGS: Decreased lung volumes are seen, likely secondary to the degree of patient inspiration. Mildly increased lung markings are noted with very mild bibasilar atelectasis, left greater than right. No pleural effusion or pneumothorax is identified. The heart size and mediastinal contours are within normal limits. The visualized skeletal structures are unremarkable. IMPRESSION: Low lung volumes with very mild bibasilar atelectasis, left greater than right. Electronically Signed   By: Aram Candela M.D.   On: 11/23/2021 21:51   CT Angio Chest Pulmonary Embolism (PE) W or WO Contrast  Result Date: 11/24/2021 CLINICAL DATA:  Positive D-dimer.  Pulmonary embolism suspected. EXAM: CT ANGIOGRAPHY CHEST WITH CONTRAST TECHNIQUE: Multidetector CT imaging of the chest was performed using the standard protocol during bolus administration of intravenous contrast. Multiplanar CT image reconstructions and MIPs were obtained to evaluate the vascular anatomy. RADIATION DOSE REDUCTION: This exam was performed according to the departmental dose-optimization program which includes  automated exposure control, adjustment of the mA and/or kV according to patient size and/or use of iterative reconstruction technique. CONTRAST:  40mL OMNIPAQUE IOHEXOL 350 MG/ML SOLN COMPARISON:  None. FINDINGS: Cardiovascular: The heart size is normal. No substantial pericardial effusion. Coronary artery calcification is evident. Mild atherosclerotic calcification is noted in the wall of the thoracic aorta. No large central pulmonary embolus identified. Although no embolus is identified in segmental or subsegmental pulmonary arteries to either lung, these more peripheral branches are largely obscured by significant breathing motion artifact. Mediastinum/Nodes: No mediastinal lymphadenopathy. There is no hilar lymphadenopathy. The esophagus has normal imaging features. There is no axillary lymphadenopathy. Lungs/Pleura: Fine architectural detail obscured by breathing motion. Within this limitation, there is no focal airspace consolidation or evidence of pulmonary edema. No suspicious pulmonary nodule or mass. Dependent atelectasis noted bilaterally. No pleural effusion. Upper Abdomen: Nonobstructing stones are seen in the upper pole of each kidney measuring up to  9 mm on the left. Musculoskeletal: No worrisome lytic or sclerotic osseous abnormality. Review of the MIP images confirms the above findings. IMPRESSION: 1. No large central pulmonary embolus. Although no embolus is identified in segmental or subsegmental pulmonary arteries to either lung, these more peripheral branches are largely obscured by significant breathing motion artifact and assessment may be unreliable. 2. Nonobstructing bilateral renal stones. 3. Aortic Atherosclerosis (ICD10-I70.0). Electronically Signed   By: Misty Stanley M.D.   On: 11/24/2021 14:22    Microbiology: Results for orders placed or performed during the hospital encounter of 11/23/21  Resp Panel by RT-PCR (Flu A&B, Covid) Nasopharyngeal Swab     Status: Abnormal   Collection  Time: 11/23/21  9:15 PM   Specimen: Nasopharyngeal Swab; Nasopharyngeal(NP) swabs in vial transport medium  Result Value Ref Range Status   SARS Coronavirus 2 by RT PCR POSITIVE (A) NEGATIVE Final    Comment: (NOTE) SARS-CoV-2 target nucleic acids are DETECTED.  The SARS-CoV-2 RNA is generally detectable in upper respiratory specimens during the acute phase of infection. Positive results are indicative of the presence of the identified virus, but do not rule out bacterial infection or co-infection with other pathogens not detected by the test. Clinical correlation with patient history and other diagnostic information is necessary to determine patient infection status. The expected result is Negative.  Fact Sheet for Patients: EntrepreneurPulse.com.au  Fact Sheet for Healthcare Providers: IncredibleEmployment.be  This test is not yet approved or cleared by the [place] FDA and  has been authorized for detection and/or diagnosis of SARS-CoV-2 by FDA under an Emergency Use Authorization (EUA).  This EUA will remain in effect (meaning this test can be used) for the duration of  the COVID-19 declaration under Section 564(b)(1) of the A ct, 21 U.S.C. section 360bbb-3(b)(1), unless the authorization is terminated or revoked sooner.     Influenza A by PCR NEGATIVE NEGATIVE Final   Influenza B by PCR NEGATIVE NEGATIVE Final    Comment: (NOTE) The Xpert Xpress SARS-CoV-2/FLU/RSV plus assay is intended as an aid in the diagnosis of influenza from Nasopharyngeal swab specimens and should not be used as a sole basis for treatment. Nasal washings and aspirates are unacceptable for Xpert Xpress SARS-CoV-2/FLU/RSV testing.  Fact Sheet for Patients: EntrepreneurPulse.com.au  Fact Sheet for Healthcare Providers: IncredibleEmployment.be  This test is not yet approved or cleared by the [place] FDA and has been  authorized for detection and/or diagnosis of SARS-CoV-2 by FDA under an Emergency Use Authorization (EUA). This EUA will remain in effect (meaning this test can be used) for the duration of the COVID-19 declaration under Section 564(b)(1) of the Act, 21 U.S.C. section 360bbb-3(b)(1), unless the authorization is terminated or revoked.  Performed at [facility], [address] [place], [place] [address]   Blood Culture (routine x 2)     Status: None (Preliminary result)   Collection Time: 11/23/21 11:37 PM   Specimen: BLOOD  Result Value Ref Range Status   Specimen Description   Final    BLOOD BOTTLES DRAWN AEROBIC ONLY Performed at [facility], [address] [place], [place] [address]    Special Requests   Final    Blood Culture results may not be optimal due to an inadequate volume of blood received in culture bottles BLOOD RIGHT FOREARM Performed at [facility], [address], [place], [place] [address]    Culture   Final    NO GROWTH 2 DAYS Performed at [facility], [address] [place], [place] [address]    Report Status PENDING  Incomplete  Blood Culture (routine x 2)     Status: None (Preliminary result)   Collection Time: 11/23/21 11:37 PM   Specimen: BLOOD  Result Value Ref Range Status   Specimen Description   Final    BLOOD BOTTLES DRAWN AEROBIC AND ANAEROBIC Performed at [facility], [address] [place], [place] [address]    Special Requests   Final    Blood Culture adequate volume RIGHT ANTECUBITAL Performed at [facility], [address], [place], [place] [address]    Culture   Final    NO GROWTH 2 DAYS Performed at [facility], [place] [address] [place], [place] [address]    Report Status PENDING  Incomplete    Labs: CBC: Recent Labs  Lab 11/23/21 2337 11/24/21 1250 11/25/21 0347 11/26/21 0353  WBC 7.1 8.1 10.2 10.7*   NEUTROABS 5.0 6.5 8.4* 9.3*  HGB 12.8* 14.0 13.9 13.6  HCT 40.1 44.8 43.1 42.7  MCV 87.7 90.3 88.7 89.0  PLT 113* 126* 135* [id number]*   Basic Metabolic Panel: Recent Labs  Lab 11/23/21 2337 11/24/21 1250 11/25/21 0347 11/26/21 0353  NA 138 136 136 136  K 3.5 4.0 3.8 3.7  CL 104 102 103 104  CO2 23 25 24 25   GLUCOSE 108* 125* 119* 163*  BUN 14 15 18 20   CREATININE 1.08 1.15 0.98 0.91  CALCIUM 8.9 8.6* 8.8* 8.5*  MG  --   --   --  2.2   Liver Function Tests: Recent Labs  Lab 11/23/21 2337 11/24/21 1250 11/25/21 0347 11/26/21 0353  AST 35 60* 67* 55*  ALT 18 26 25 29   ALKPHOS 40 44 41 38  BILITOT 0.4 0.6 0.5 0.4  PROT 6.7 7.6 7.5 6.8  ALBUMIN 3.7 3.5 3.3* 3.0*   CBG: No results for input(s): GLUCAP in the last 168 hours.  Discharge time spent: greater than 30 minutes.  Signed: Dwyane Dee, MD [facility] 11/26/2021

## 2021-11-26 NOTE — Assessment & Plan Note (Addendum)
-   Febrile, tachycardia, tachypnea.  Source considered pulmonary from viral infection; new oxygen demand on admission -see COVID-19 treatment

## 2021-11-26 NOTE — Progress Notes (Addendum)
RN contacted [employer] to arrange for patient transport back to home; had to leave voice message; awaiting return call or RN will try calling again.  RN received call back @1416 ; RN was told ride will arrive at approx.1530.

## 2021-11-27 ENCOUNTER — Telehealth: Payer: Self-pay

## 2021-11-27 NOTE — Telephone Encounter (Signed)
Transition Care Management Follow-up Telephone Call  Call completed with Harlene Salts  - DPR on file to speak with her. Patient was asleep at the time of this call.  Date of discharge and from where: 11/26/2021, [facility]  How have you been since you were released from the hospital? Gabriel Rung stated that he is doing fine, no difficulty breathing.  Any questions or concerns? Yes - Monique explained that he came home with 2 " pumps" but they were in a bag and got wet when water was spilled in the bag.  She could not describe them, stating they might be metal, maybe plastic, maybe medication, maybe an inhaler or incentive spirometer.  She could not get to them at the time of this call.  Instructed her to check them when she is able to and call this clinic so she can better describe what these ' pumps" are if she is not able to determine what they are.   Items Reviewed: Did the pt receive and understand the discharge instructions provided?  Gabriel Rung stated that the patient had the instruction in a plastic bag and a cup of water spilled in the bag and the instructions are not legible. Reviewed medication instructions.   Medications obtained and verified?  He has the aspirin but not the amlodipine. Gabriel Rung is not sure why it was sent to a pharmacy on [address]. Instructed her to call the pharmacy that he would prefer to have fill it and they can transfer the prescription.  Other? No  Any new allergies since your discharge? No  Dietary orders reviewed? Yes Do you have support at home? Yes   Home Care and Equipment/Supplies: Were home health services ordered? no If so, what is the name of the agency? N/a  Has the agency set up a time to come to the patient's home? not applicable Were any new equipment or medical supplies ordered?  No What is the name of the medical supply agency? N/a Were you able to get the supplies/equipment? not applicable Do you have any questions related to the use of  the equipment or supplies? No  Functional Questionnaire: (I = Independent and D = Dependent) ADLs: independent. Gabriel Rung provides any assistance if needed.   Follow up appointments reviewed:  PCP Hospital f/u appt confirmed? Yes  Scheduled to see Dr Laural Benes  on 12/11/2021.  Specialist Hospital f/u appt confirmed?  None scheduled at this time.    Are transportation arrangements needed? Yes - instructed her to call patient's insurance company to inquire about transportation services to medical appointments.  If their condition worsens, is the pt aware to call PCP or go to the Emergency Dept.? Yes Was the patient provided with contact information for the PCP's office or ED? Yes Was to pt encouraged to call back with questions or concerns? Yes

## 2021-11-29 LAB — CULTURE, BLOOD (ROUTINE X 2)
Culture: NO GROWTH
Culture: NO GROWTH
Special Requests: ADEQUATE

## 2021-12-11 ENCOUNTER — Ambulatory Visit: Payer: Medicare Other | Admitting: Internal Medicine

## 2022-10-05 IMAGING — CT CT ANGIO CHEST
2 of 7 series · 17 of 46 positions shown · IV contrast (APPLIED)
Comparison: None.

CLINICAL DATA: Positive D-dimer.  Pulmonary embolism suspected.

EXAM:
CT ANGIOGRAPHY CHEST WITH CONTRAST
TECHNIQUE: Multidetector CT imaging of the chest was performed using the
standard protocol during bolus administration of intravenous
contrast. Multiplanar CT image reconstructions and MIPs were
obtained to evaluate the vascular anatomy.

[Series 5: thins · axial · 0.71mm/px · z∈[+1488,+1753]mm · 15 of 303 slices shown]
[im 19/303  lung]
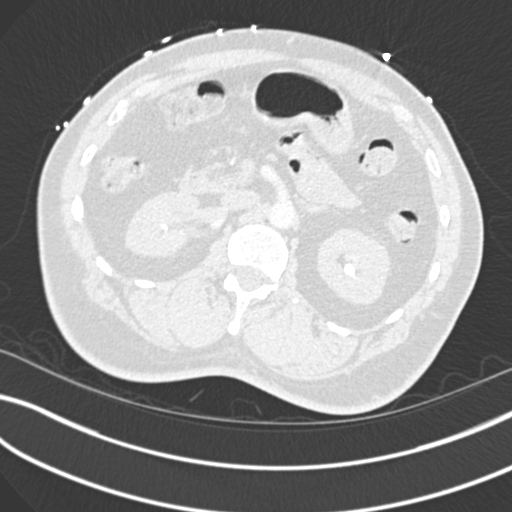
[im 38/303  soft-tissue]
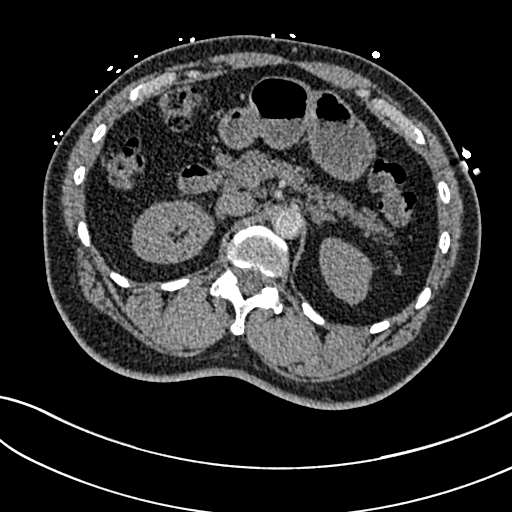
[im 57/303  lung]
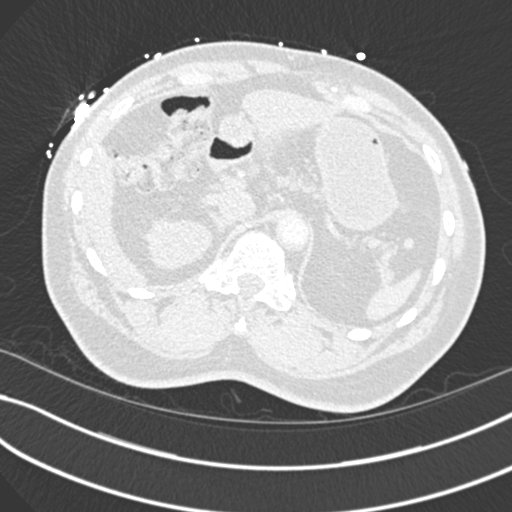
[im 76/303  soft-tissue]
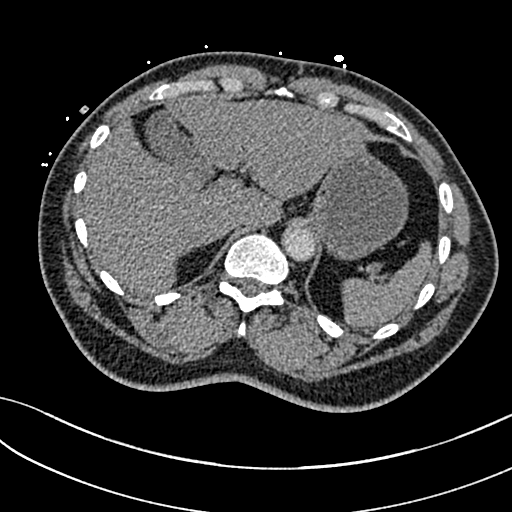
[im 95/303  lung]
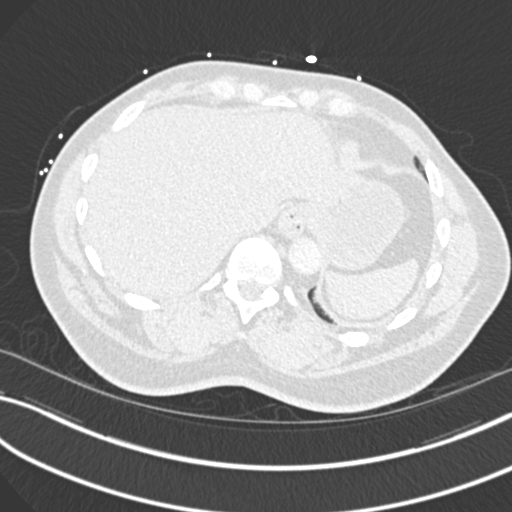
[im 114/303  soft-tissue]
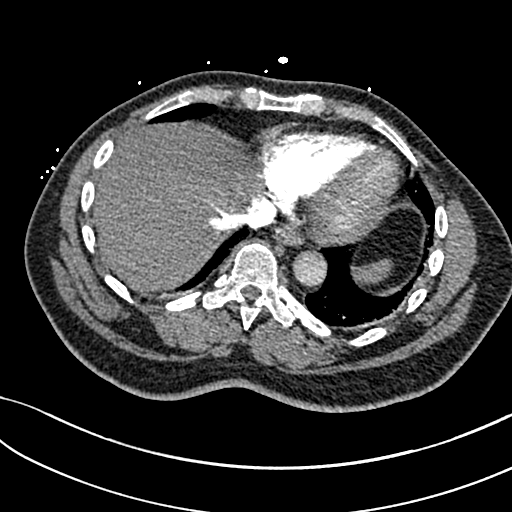
[im 133/303  lung]
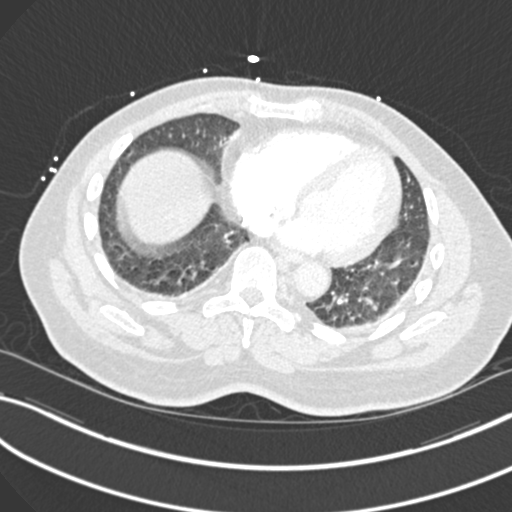
[im 152/303  soft-tissue]
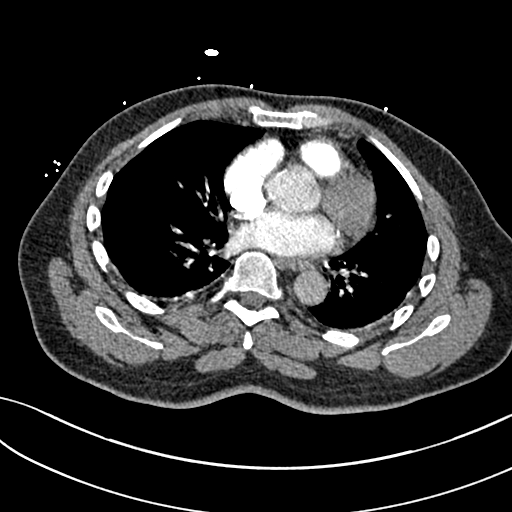
[im 170/303  lung]
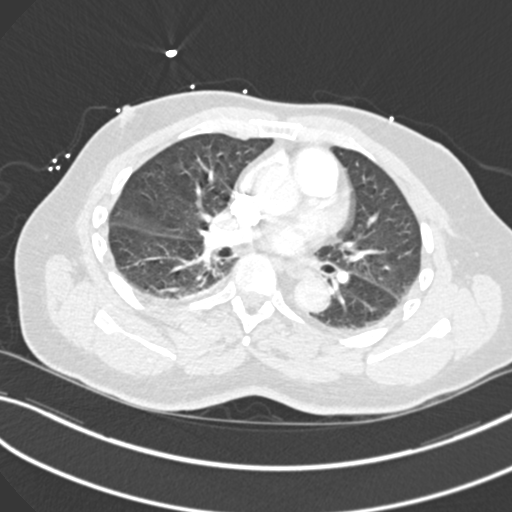
[im 189/303  soft-tissue]
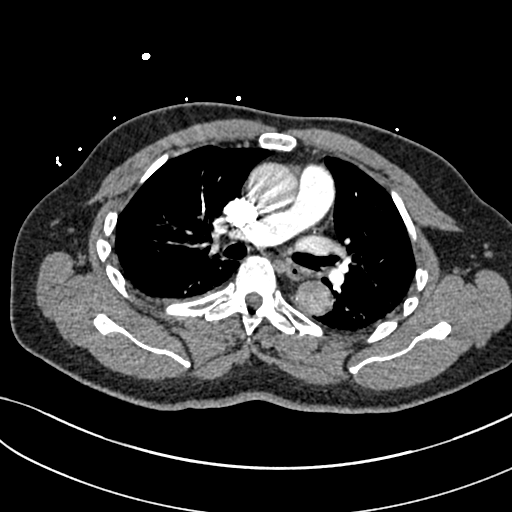
[im 208/303  lung]
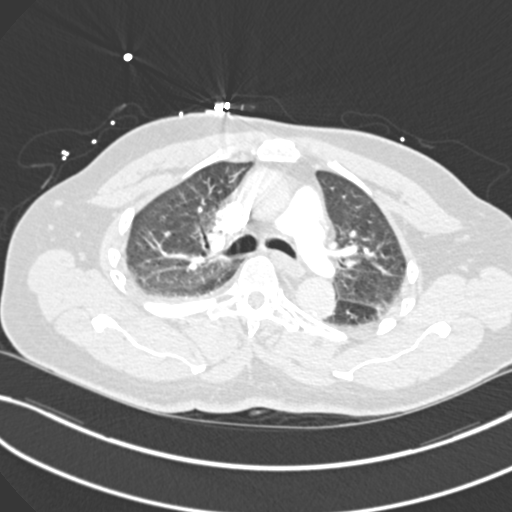
[im 227/303  soft-tissue]
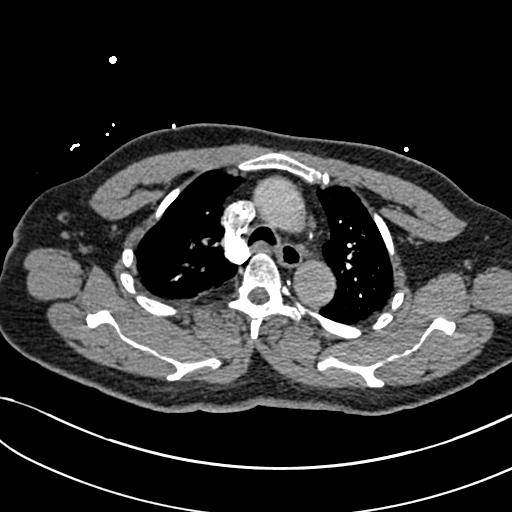
[im 246/303  lung]
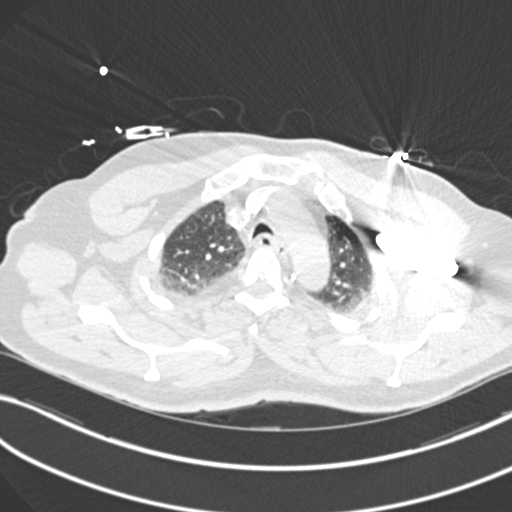
[im 265/303  soft-tissue]
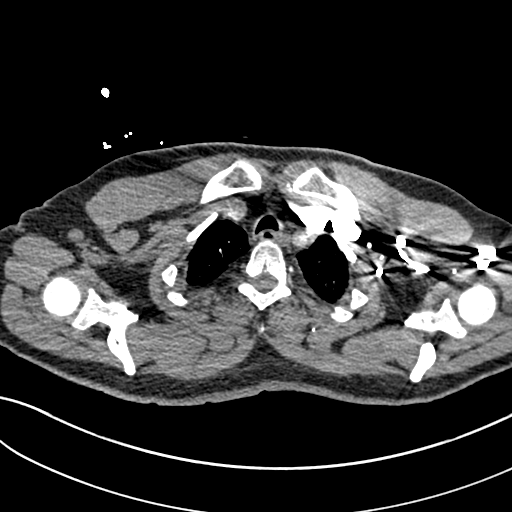
[im 284/303  lung]
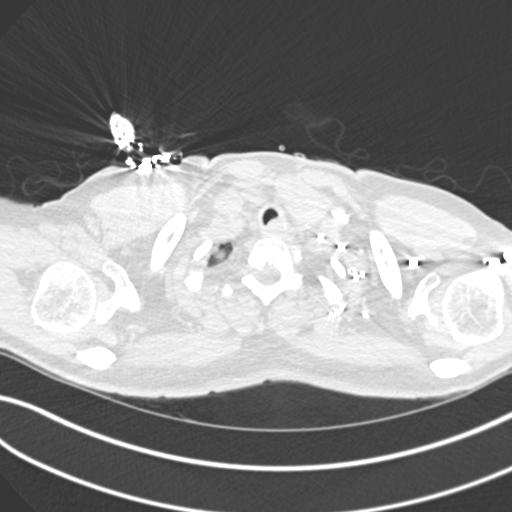

[Series 6: coronal mpr · coronal · 0.59mm/px · 2 of 91 slices shown]
[im 31/91  soft-tissue]
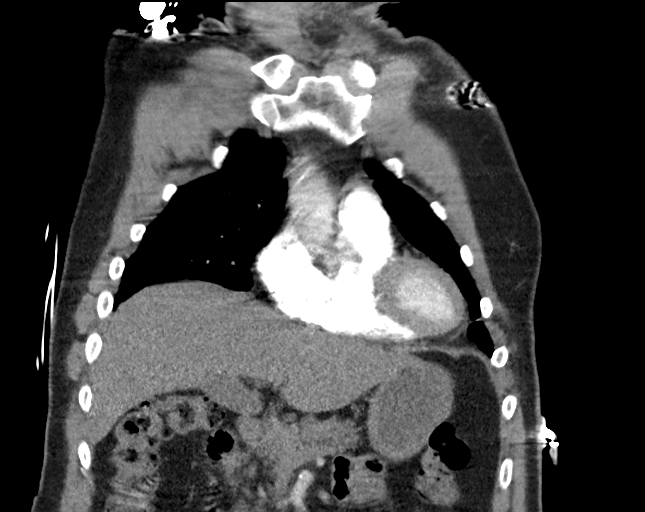
[im 61/91  soft-tissue]
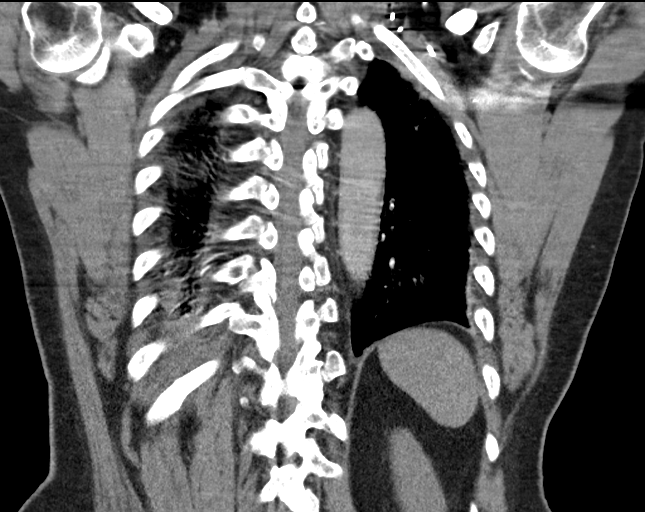

[17 of 46 positions shown; findings below may reference images not displayed]

RADIATION DOSE REDUCTION: This exam was performed according to the
departmental dose-optimization program which includes automated
exposure control, adjustment of the mA and/or kV according to
patient size and/or use of iterative reconstruction technique.

CONTRAST:  80mL OMNIPAQUE IOHEXOL 350 MG/ML SOLN
FINDINGS: Cardiovascular: The heart size is normal. No substantial pericardial
effusion. Coronary artery calcification is evident. Mild
atherosclerotic calcification is noted in the wall of the thoracic
aorta. No large central pulmonary embolus identified. Although no
embolus is identified in segmental or subsegmental pulmonary
arteries to either lung, these more peripheral branches are largely
obscured by significant breathing motion artifact.

Mediastinum/Nodes: No mediastinal lymphadenopathy. There is no hilar
lymphadenopathy. The esophagus has normal imaging features. There is
no axillary lymphadenopathy.

Lungs/Pleura: Fine architectural detail obscured by breathing
motion. Within this limitation, there is no focal airspace
consolidation or evidence of pulmonary edema. No suspicious
pulmonary nodule or mass. Dependent atelectasis noted bilaterally.
No pleural effusion.

Upper Abdomen: Nonobstructing stones are seen in the upper pole of
each kidney measuring up to 9 mm on the left.

Musculoskeletal: No worrisome lytic or sclerotic osseous
abnormality.

Review of the MIP images confirms the above findings.
IMPRESSION: 1. No large central pulmonary embolus. Although no embolus is
identified in segmental or subsegmental pulmonary arteries to either
lung, these more peripheral branches are largely obscured by
significant breathing motion artifact and assessment may be
unreliable.
2. Nonobstructing bilateral renal stones.
3. Aortic Atherosclerosis ([id number]).

## 2023-03-17 ENCOUNTER — Ambulatory Visit: Payer: Medicare Other | Admitting: Podiatry
